# Patient Record
Sex: Female | Born: 1985
Health system: Southern US, Community
[De-identification: ages and names within clinical notes are randomized; demographics above are authoritative.]

## PROBLEM LIST (undated history)

## (undated) DIAGNOSIS — E0590 Thyrotoxicosis, unspecified without thyrotoxic crisis or storm: Secondary | ICD-10-CM

## (undated) HISTORY — DX: Thyrotoxicosis, unspecified without thyrotoxic crisis or storm: E05.90

---

## 2016-08-28 DIAGNOSIS — E538 Deficiency of other specified B group vitamins: Secondary | ICD-10-CM | POA: Insufficient documentation

## 2018-09-10 LAB — POCT URINE PREGNANCY: Preg Test, Ur: POSITIVE — AB

## 2018-09-10 LAB — CHG URINE PREGNANCY TEST VISUAL COLOR CMPRSN METHS: Preg Test, Ur: POSITIVE

## 2018-09-29 ENCOUNTER — Encounter: Payer: Self-pay | Admitting: Obstetrics and Gynecology

## 2018-09-30 ENCOUNTER — Encounter: Payer: Self-pay | Admitting: Obstetrics and Gynecology

## 2018-09-30 ENCOUNTER — Ambulatory Visit: Payer: Self-pay

## 2018-09-30 ENCOUNTER — Ambulatory Visit (INDEPENDENT_AMBULATORY_CARE_PROVIDER_SITE_OTHER): Payer: No Typology Code available for payment source | Admitting: Obstetrics and Gynecology

## 2018-09-30 VITALS — BP 117/68 | HR 96 | Ht 63.0 in | Wt 164.0 lb

## 2018-09-30 DIAGNOSIS — O09899 Supervision of other high risk pregnancies, unspecified trimester: Secondary | ICD-10-CM

## 2018-09-30 DIAGNOSIS — Z98891 History of uterine scar from previous surgery: Secondary | ICD-10-CM

## 2018-09-30 DIAGNOSIS — O0990 Supervision of high risk pregnancy, unspecified, unspecified trimester: Secondary | ICD-10-CM

## 2018-09-30 DIAGNOSIS — O09891 Supervision of other high risk pregnancies, first trimester: Secondary | ICD-10-CM | POA: Diagnosis not present

## 2018-09-30 DIAGNOSIS — R221 Localized swelling, mass and lump, neck: Secondary | ICD-10-CM | POA: Diagnosis not present

## 2018-09-30 DIAGNOSIS — Z3A00 Weeks of gestation of pregnancy not specified: Secondary | ICD-10-CM

## 2018-09-30 DIAGNOSIS — Z8639 Personal history of other endocrine, nutritional and metabolic disease: Secondary | ICD-10-CM

## 2018-09-30 DIAGNOSIS — O0991 Supervision of high risk pregnancy, unspecified, first trimester: Secondary | ICD-10-CM | POA: Diagnosis not present

## 2018-09-30 DIAGNOSIS — O3680X0 Pregnancy with inconclusive fetal viability, not applicable or unspecified: Secondary | ICD-10-CM

## 2018-09-30 DIAGNOSIS — Z8619 Personal history of other infectious and parasitic diseases: Secondary | ICD-10-CM

## 2018-09-30 NOTE — Progress Notes (Signed)
Pt informed that the ultrasound is considered a limited OB ultrasound and is not intended to be a complete ultrasound exam.  Patient also informed that the ultrasound is not being completed with the intent of assessing for fetal or placental anomalies or any pelvic abnormalities.  Explained that the purpose of today's ultrasound is to assess for viability, confirm dates.  Patient acknowledges the purpose of the exam and the limitations of the study.

## 2018-09-30 NOTE — Progress Notes (Signed)
Thyroid US @ [facility] scheduled for February 14th @ 1130.  Pt notified.

## 2018-09-30 NOTE — Progress Notes (Signed)
New OB Note  09/30/2018   [facility]  Chief Complaint: NOB  Transfer of Care Patient: no  History of Present Illness: Ms. Frances MostCharles is a 33 y.o. [id number] @ 7/1 weeks (EDC 9/29, based on Patient's last menstrual period was 08/11/2018 (exact date).=7wk u/s today.  Preg complicated by has History of cesarean delivery; History of herpes genitalis; History of hyperthyroidism; Short interval between pregnancies affecting pregnancy, antepartum; Neck fullness; and Supervision of high risk pregnancy, antepartum on their problem list.   Any events prior to today's visit: no Her periods were: qmonth, regular She was using no method when she conceived.  She has mild signs or symptoms of nausea/vomiting of pregnancy. She has Negative signs or symptoms of miscarriage or preterm labor On any medications around the time she conceived/early pregnancy: No   ROS: A 12-point review of systems was performed and negative, except as stated in the above HPI.  OBGYN History: As per HPI. OB History  Gravida Para Term Preterm AB Living  8 3 3   3 4   SAB TAB Ectopic Multiple Live Births  2   1   4     # Outcome Date GA Lbr Len/2nd Weight Sex Delivery Anes PTL Lv  8 Current           7 Term 03/09/18 5699w0d   F CS-LTranv Spinal  LIV  6 Term 09/18/16 5599w0d   M CS-LTranv Spinal    5 Gravida 12/08/12 4299w0d   M CS-LTranv Spinal  LIV  4 SAB 2013          3 Term 01/10/11 3499w0d   M CS-LTranv Spinal  LIV  2 Ectopic 2011          1 SAB 2010       N     Any issues with any prior pregnancies: no Prior children are healthy, doing well, and without any problems or issues: yes History of pap smears: Yes. Last pap smear 2019 and results were NILM/HPV neg   Past Medical History: Past Medical History:  Diagnosis Date  . Hyperthyroidism     Past Surgical History: Past Surgical History:  Procedure Laterality Date  . CESAREAN SECTION      Family History:  Family History  Problem  Relation Age of Onset  . Diabetes Mother   . Stroke Mother   . Diabetes Father   . Hypertension Father    She denies any history of mental retardation, birth defects or genetic disorders in her or the FOB's history  Social History:  Social History   Socioeconomic History  . Marital status: Married    Spouse name: Not on file  . Number of children: Not on file  . Years of education: Not on file  . Highest education level: Not on file  Occupational History  . Not on file  Social Needs  . Financial resource strain: Not on file  . Food insecurity:    Worry: Not on file    Inability: Not on file  . Transportation needs:    Medical: Not on file    Non-medical: Not on file  Tobacco Use  . Smoking status: Not on file  Substance and Sexual Activity  . Alcohol use: Not on file  . Drug use: Not on file  . Sexual activity: Not on file  Lifestyle  . Physical activity:    Days per week: Not on file    Minutes per session: Not on file  . Stress:  Not on file  Relationships  . Social connections:    Talks on phone: Not on file    Gets together: Not on file    Attends religious service: Not on file    Active member of club or organization: Not on file    Attends meetings of clubs or organizations: Not on file    Relationship status: Not on file  . Intimate partner violence:    Fear of current or ex partner: Not on file    Emotionally abused: Not on file    Physically abused: Not on file    Forced sexual activity: Not on file  Other Topics Concern  . Not on file  Social History Narrative  . Not on file     Allergy: No Known Allergies  Health Maintenance:  Mammogram Up to Date: not applicable  Current Outpatient Medications: PNV  Physical Exam:   BP 117/68   Pulse 96   Ht 5\' 3"  (1.6 m)   Wt 164 lb (74.4 kg)   LMP 08/11/2018 (Exact Date)   BMI 29.05 kg/m  Body mass index is 29.05 kg/m. Contractions: Not present Vag. Bleeding: None. FHTs: 130s  General  appearance: Well nourished, well developed female in no acute distress.  Neck:  Supple ?fullness bilaterally Cardiovascular: S1, S2 normal, no murmur, rub or gallop, regular rate and rhythm Respiratory:  Clear to auscultation bilateral. Normal respiratory effort Abdomen: positive bowel sounds and no masses, hernias; diffusely non tender to palpation, non distended Breasts: pt denies any breast s/s Neuro/Psych:  Normal mood and affect.  Skin:  Warm and dry.   Laboratory: none  Imaging:  U/s with SLIUP with FHR 160s at 6/6wks  Assessment: pt doing well  Plan: 1. History of cesarean delivery Will need repeat. Ask about BTL in late 2nd trimester  2. History of herpes genitalis ppx at 34-36wks  3. History of hyperthyroidism In patient's PCP notes. Not on any meds. Will check labs - TSH - T4, free - US THYROID; Future  4. Short interval between pregnancies affecting pregnancy, antepartum Still breastfeeding. Pt told okay to continue  5. Supervision of high risk pregnancy, antepartum Routine care. Offer. cffdna and carrier screening nv. Pt states had deliveries at [facility]. Pt's name in epic wrong here. Will have front desk change and see if we can get records from care everywhere re: ob hx, pap hx, and any genetics already done.   Pt told to let us know if she thinks she needs anything for her nausea with pregnancy.  - TSH - T4, free - Hemoglobin A1c - Comprehensive metabolic panel - Protein / creatinine ratio, urine - HIV antibody (with reflex) - Culture, OB Urine - Cervicovaginal ancillary only - US THYROID; Future - Obstetric Panel, Including HIV  6. Neck fullness - US THYROID; Future  7. Encounter to determine fetal viability of pregnancy, single or unspecified fetus - US OB Limited; Future  Problem list reviewed and updated.  Follow up in 3 weeks.   >50% of 35 min visit spent on counseling and coordination of care.     Cornelia Copa  MD Attending Center for [facility])

## 2018-10-01 LAB — PROTEIN / CREATININE RATIO, URINE
CREATININE, UR: 200.9 mg/dL
PROTEIN UR: 20.3 mg/dL
Protein/Creat Ratio: 101 mg/g creat (ref 0–200)

## 2018-10-01 LAB — OBSTETRIC PANEL, INCLUDING HIV
Antibody Screen: NEGATIVE
BASOS ABS: 0.1 10*3/uL (ref 0.0–0.2)
Basos: 1 %
EOS (ABSOLUTE): 0.2 10*3/uL (ref 0.0–0.4)
Eos: 2 %
HIV Screen 4th Generation wRfx: NONREACTIVE
Hematocrit: 35.7 % (ref 34.0–46.6)
Hemoglobin: 11.9 g/dL (ref 11.1–15.9)
Hepatitis B Surface Ag: NEGATIVE
Immature Grans (Abs): 0 10*3/uL (ref 0.0–0.1)
Immature Granulocytes: 0 %
Lymphocytes Absolute: 2 10*3/uL (ref 0.7–3.1)
Lymphs: 19 %
MCH: 27.9 pg (ref 26.6–33.0)
MCHC: 33.3 g/dL (ref 31.5–35.7)
MCV: 84 fL (ref 79–97)
Monocytes Absolute: 0.9 10*3/uL (ref 0.1–0.9)
Monocytes: 8 %
Neutrophils Absolute: 7.5 10*3/uL — ABNORMAL HIGH (ref 1.4–7.0)
Neutrophils: 70 %
Platelets: 395 10*3/uL (ref 150–450)
RBC: 4.27 x10E6/uL (ref 3.77–5.28)
RDW: 14.1 % (ref 11.7–15.4)
RPR Ser Ql: NONREACTIVE
Rh Factor: POSITIVE
Rubella Antibodies, IGG: 7.11 index (ref >0.99)
WBC: 10.6 10*3/uL (ref 3.4–10.8)

## 2018-10-01 LAB — COMPREHENSIVE METABOLIC PANEL
ALT: 14 IU/L (ref 0–32)
AST: 13 IU/L (ref 0–40)
Albumin/Globulin Ratio: 1.6 (ref 1.2–2.2)
Albumin: 4.1 g/dL (ref 3.8–4.8)
Alkaline Phosphatase: 81 IU/L (ref 39–117)
BUN/Creatinine Ratio: 14 (ref 9–23)
BUN: 9 mg/dL (ref 6–20)
Bilirubin Total: 0.3 mg/dL (ref 0.0–1.2)
CO2: 18 mmol/L — ABNORMAL LOW (ref 20–29)
Calcium: 9.6 mg/dL (ref 8.7–10.2)
Chloride: 102 mmol/L (ref 96–106)
Creatinine, Ser: 0.64 mg/dL (ref 0.57–1.00)
GFR calc Af Amer: 137 mL/min/{1.73_m2} (ref >59)
GFR calc non Af Amer: 118 mL/min/{1.73_m2} (ref >59)
Globulin, Total: 2.5 g/dL (ref 1.5–4.5)
Glucose: 71 mg/dL (ref 65–99)
Potassium: 4.2 mmol/L (ref 3.5–5.2)
Sodium: 136 mmol/L (ref 134–144)
Total Protein: 6.6 g/dL (ref 6.0–8.5)

## 2018-10-01 LAB — HEMOGLOBIN A1C
Est. average glucose Bld gHb Est-mCnc: 88 mg/dL
HEMOGLOBIN A1C: 4.7 % — AB (ref 4.8–5.6)

## 2018-10-01 LAB — TSH: TSH: 0.066 u[IU]/mL — ABNORMAL LOW (ref 0.450–4.500)

## 2018-10-01 LAB — T4, FREE: Free T4: 1.28 ng/dL (ref 0.82–1.77)

## 2018-10-02 ENCOUNTER — Ambulatory Visit (HOSPITAL_COMMUNITY)
Admission: RE | Admit: 2018-10-02 | Discharge: 2018-10-02 | Disposition: A | Discharge status: Home / Self Care | Payer: No Typology Code available for payment source | Source: Ambulatory Visit | Attending: Obstetrics and Gynecology | Admitting: Obstetrics and Gynecology

## 2018-10-02 DIAGNOSIS — R221 Localized swelling, mass and lump, neck: Secondary | ICD-10-CM | POA: Insufficient documentation

## 2018-10-02 DIAGNOSIS — Z8639 Personal history of other endocrine, nutritional and metabolic disease: Secondary | ICD-10-CM | POA: Diagnosis not present

## 2018-10-02 DIAGNOSIS — O0990 Supervision of high risk pregnancy, unspecified, unspecified trimester: Secondary | ICD-10-CM | POA: Diagnosis present

## 2018-10-02 LAB — URINE CULTURE, OB REFLEX: Organism ID, Bacteria: NO GROWTH

## 2018-10-02 LAB — CULTURE, OB URINE

## 2018-10-03 ENCOUNTER — Encounter: Payer: Self-pay | Admitting: Obstetrics and Gynecology

## 2018-10-07 ENCOUNTER — Telehealth: Payer: Self-pay | Admitting: General Practice

## 2018-10-07 ENCOUNTER — Other Ambulatory Visit: Payer: Self-pay | Admitting: Obstetrics and Gynecology

## 2018-10-07 DIAGNOSIS — R221 Localized swelling, mass and lump, neck: Secondary | ICD-10-CM

## 2018-10-07 DIAGNOSIS — E038 Other specified hypothyroidism: Secondary | ICD-10-CM

## 2018-10-07 DIAGNOSIS — E042 Nontoxic multinodular goiter: Secondary | ICD-10-CM

## 2018-10-07 DIAGNOSIS — E039 Hypothyroidism, unspecified: Secondary | ICD-10-CM

## 2018-10-07 NOTE — Telephone Encounter (Signed)
Patient called and left message requesting thyroid test results. Reviewed with Dr Vergie Living who states patient needs endocrinology referral for hyperthyroidism. Referral placed to [facility].  Called patient and informed her of results and discussed referral. Told patient they should call her with an appointment but if she doesn't hear anything within a few days to call us back. Patient verbalized understanding & had no questions.

## 2018-10-22 ENCOUNTER — Encounter: Payer: Self-pay | Admitting: Obstetrics & Gynecology

## 2018-10-26 ENCOUNTER — Encounter: Payer: Self-pay | Admitting: Family Medicine

## 2018-10-26 ENCOUNTER — Ambulatory Visit (INDEPENDENT_AMBULATORY_CARE_PROVIDER_SITE_OTHER): Payer: Medicaid Other | Admitting: Family Medicine

## 2018-10-26 VITALS — BP 107/74 | HR 88 | Wt 160.6 lb

## 2018-10-26 DIAGNOSIS — O0991 Supervision of high risk pregnancy, unspecified, first trimester: Secondary | ICD-10-CM

## 2018-10-26 DIAGNOSIS — Z3A10 10 weeks gestation of pregnancy: Secondary | ICD-10-CM | POA: Diagnosis not present

## 2018-10-26 DIAGNOSIS — O09899 Supervision of other high risk pregnancies, unspecified trimester: Secondary | ICD-10-CM

## 2018-10-26 DIAGNOSIS — O0990 Supervision of high risk pregnancy, unspecified, unspecified trimester: Secondary | ICD-10-CM

## 2018-10-26 DIAGNOSIS — Z8639 Personal history of other endocrine, nutritional and metabolic disease: Secondary | ICD-10-CM

## 2018-10-26 NOTE — Progress Notes (Signed)
   PRENATAL VISIT NOTE  Subjective:  Frances Gould is a 33 y.o. [phone] at [redacted]w[redacted]d being seen today for ongoing prenatal care.  She is currently monitored for the following issues for this high-risk pregnancy and has History of cesarean delivery; History of herpes genitalis; History of hyperthyroidism; Short interval between pregnancies affecting pregnancy, antepartum; Neck fullness; and Supervision of high risk pregnancy, antepartum on their problem list.  Patient reports no complaints.  Contractions: Not present. Vag. Bleeding: None.  Movement: Absent. Denies leaking of fluid.   The following portions of the patient's history were reviewed and updated as appropriate: allergies, current medications, past family history, past medical history, past social history, past surgical history and problem list.   Objective:   Vitals:   10/26/18 1458  BP: 107/74  Pulse: 88  Weight: 160 lb 9.6 oz (72.8 kg)    Fetal Status: Fetal Heart Rate (bpm): 172   Movement: Absent     General:  Alert, oriented and cooperative. Patient is in no acute distress.  Skin: Skin is warm and dry. No rash noted.   Cardiovascular: Normal heart rate noted  Respiratory: Normal respiratory effort, no problems with respiration noted  Abdomen: Soft, gravid, appropriate for gestational age.  Pain/Pressure: Absent     Pelvic: Cervical exam deferred        Extremities: Normal range of motion.  Edema: None  Mental Status: Normal mood and affect. Normal behavior. Normal judgment and thought content.   Assessment and Plan:  Pregnancy: [id number] at [redacted]w[redacted]d 1. History of hyperthyroidism Repeat TFT's--to call and schedule with Endocrinology--risks reviewed - TSH - T3, free - T4, free  2. Short interval between pregnancies affecting pregnancy, antepartum   3. Supervision of high risk pregnancy, antepartum NIPT today - Genetic Screening  General obstetric precautions including but not limited to vaginal bleeding,  contractions, leaking of fluid and fetal movement were reviewed in detail with the patient. Please refer to After Visit Summary for other counseling recommendations.   Return in 4 weeks (on 11/23/2018).  Future Appointments  Date Time Provider Department Center  11/30/2018 10:15 AM Anyanwu, Jethro Bastos, MD [facility]    Reva Bores, MD

## 2018-10-26 NOTE — Patient Instructions (Signed)

## 2018-10-27 LAB — T3, FREE: T3, Free: 4.4 pg/mL (ref 2.0–4.4)

## 2018-10-27 LAB — T4, FREE: Free T4: 1.82 ng/dL — ABNORMAL HIGH (ref 0.82–1.77)

## 2018-10-27 LAB — TSH: TSH: <0.006 u[IU]/mL — ABNORMAL LOW (ref 0.450–4.500)

## 2018-11-03 ENCOUNTER — Encounter: Payer: Self-pay | Admitting: *Deleted

## 2018-11-03 LAB — POCT URINE PREGNANCY: Preg Test, Ur: POSITIVE — AB

## 2018-11-09 ENCOUNTER — Encounter: Payer: Self-pay | Admitting: *Deleted

## 2018-11-09 ENCOUNTER — Telehealth: Payer: Self-pay | Admitting: *Deleted

## 2018-11-09 NOTE — Telephone Encounter (Signed)
Melaya left a voice message she is calling about labwork for genetic tests

## 2018-11-11 NOTE — Progress Notes (Addendum)
Patient ID: Frances Gould, female   DOB: 30-Jan-1986, 33 y.o.   MRN: [id number]                                                                                                               Reason for Appointment:  Hyperthyroidism, new consultation  Referring healthcare provider: Dr. Banks Bing   Chief complaint: Overactive thyroid   History of Present Illness:   In 2011 the patient had been diagnosed with hyperthyroidism This was related to her pregnancy However she thinks that even before the pregnancy she had some minor symptoms of palpitations, feeling hot and sweaty, getting jittery and shaky and tendency to lose weight despite increased appetite The symptoms are more prominent during her pregnancy along with symptoms of weakness and fatigue Her labs in 2011 showed a high free T4 of 1.35 with low TSH.  She was likely treated with antithyroid drugs at that time  She apparently had more significant symptoms during her pregnancy in 2013 when her free T4 was 1.42 At that time antithyroid antibodies were negative but not tested for thyrotropin receptor antibody At that time she was treated with antithyroid drugs during pregnancy, initially PTU and subsequently methimazole Thyroid levels were back to normal in 10/2012 including TSH Apparently subsequently she did not have recurrence of hyperthyroidism despite below normal TSH which was persistent  Currently the patient is about 13 to [redacted] weeks pregnant  She continues to have symptoms of fatigue and weakness, some palpitations and feeling hot and sweaty along with shakiness but she does not think she has consistent symptoms daily She has not had any excessive vomiting recently  She appears to have lost weight despite advancing pregnancy  Wt Readings from Last 3 Encounters:  11/12/18 157 lb 9.6 oz (71.5 kg)  10/26/18 160 lb 9.6 oz (72.8 kg)  09/30/18 164 lb (74.4 kg)     Treatments so far: None  Thyroid function tests as  follows:     Lab Results  Component Value Date   FREET4 1.82 (H) 10/26/2018   FREET4 1.28 09/30/2018   T3FREE 4.4 10/26/2018   TSH <0.006 (L) 10/26/2018   TSH 0.066 (L) 09/30/2018    No results found for: THYROTRECAB   Allergies as of 11/12/2018   No Known Allergies     Medication List    as of November 12, 2018  4:36 PM   You have not been prescribed any medications.         Past Medical History:  Diagnosis Date  . Hyperthyroidism     Past Surgical History:  Procedure Laterality Date  . CESAREAN SECTION      Family History  Problem Relation Age of Onset  . Diabetes Mother   . Stroke Mother   . Diabetes Father   . Hypertension Father     Social History:  does not have a smoking history on file. She has never used smokeless tobacco. She reports previous alcohol use. She reports previous drug use.  Allergies: No Known Allergies  Review of Systems  Constitutional: Positive for weight loss.  HENT: Negative for trouble swallowing.   Eyes: Negative for visual disturbance.  Respiratory: Positive for shortness of breath.        She may get short of breath on exertion  Cardiovascular: Positive for palpitations.  Gastrointestinal:       Has had only minor nausea, recently better  Endocrine: Positive for heat intolerance. Negative for polydipsia.  Genitourinary: Positive for frequency.  Musculoskeletal: Negative for joint pain.  Skin: Negative for itching.  Neurological: Positive for weakness and tremors.  Psychiatric/Behavioral: Positive for nervousness.       Examination:   BP 130/62 (BP Location: Left Arm, Patient Position: Sitting, Cuff Size: Normal)   Pulse 80   Temp 98 F (36.7 C) (Oral)   Ht  (1.6 m)   Wt 157 lb 9.6 oz (71.5 kg)   LMP 08/11/2018 (Exact Date)   SpO2 99%   BMI 27.92 kg/m    General Appearance:  well-built and nourished, pleasant, not anxious or hyperkinetic.         Eyes: No abnormal prominence, lid lag or stare present.   No swelling of the eyelids   Neck: The thyroid is enlarged about 2-1/2 times normal on the right and about twice normal on the left, smooth, non-tender and diffuse.  There is some lymphadenopathy with very small nodes in the neck in the lateral parts mostly on the left.           Heart: normal S1 and S2, no murmurs .          Lungs: breath sounds are normal bilaterally without added sounds  Abdomen: no hepatosplenomegaly Pregnant uterus palpable  Extremities: hands are warm. No ankle edema.  Neurological:  No fine tremors are present. Deep tendon reflexes at biceps are brisk.  Skin: No rash, abnormal thickening of the skin on the lower legs seen     Assessment/Plan:   Hyperthyroidism, either Graves' disease or gestational transient thyrotoxicosis  Patient appears to have had recurrent hyperthyroidism during her pregnancies since 2011 Does not have hyperthyroidism in the interim indicating that gestational transient thyrotoxicosis is more likely Currently and previously has not had any eye signs of Graves' disease  Also previously has not had any positive microsomal antithyroid antibodies although thyrotropin receptor antibody has never been checked  However presence of goiter indicates that she may have Graves' disease unless her goiter is unrelated   Discussed with the patient the causes of hyperthyroidism including autoimmune nature of Graves' disease  Although patient is symptomatic recently she objectively does not have any typical signs of hyperthyroidism or tachycardia Also her free T4 is minimally increased along with upper normal free T3  For diagnostic purposes will need to check thyrotropin receptor antibody and recheck her thyroid levels If her thyroid levels are spontaneously improving this will be in favor of gestational transient thyrotoxicosis Also discussed that use of antithyroid drugs should be minimized during pregnancy and the target thyroid levels do not  need to be within the normal range  Patient understands the above discussion and treatment options. All questions were answered satisfactorily  Consult note sent to referring physician  Reather Littler 11/12/2018, 4:36 PM    Note: This office note was prepared with Dragon voice recognition system technology. Any transcriptional errors that result from this process are unintentional.  ADDENDUM: Thyroid levels and thyrotropin antibody normal indicating gestational transient thyrotoxicosis and no antithyroid drugs needed   Office Visit on 11/12/2018  Component Date Value Ref Range Status  . Thyrotropin Receptor Ab 11/12/2018 <1.10  0.00 - 1.75 IU/L Final  . Free T4 11/12/2018 1.14  0.60 - 1.60 ng/dL Final   Comment: Specimens from patients who are undergoing biotin therapy and /or ingesting biotin supplements may contain high levels of biotin.  The higher biotin concentration in these specimens interferes with this Free T4 assay.  Specimens that contain high levels  of biotin may cause false high results for this Free T4 assay.  Please interpret results in light of the total clinical presentation of the patient.    . T3, Free 11/12/2018 4.0  2.3 - 4.2 pg/mL Final

## 2018-11-12 ENCOUNTER — Other Ambulatory Visit: Payer: Self-pay

## 2018-11-12 ENCOUNTER — Ambulatory Visit (INDEPENDENT_AMBULATORY_CARE_PROVIDER_SITE_OTHER): Payer: Medicaid Other | Admitting: Endocrinology

## 2018-11-12 ENCOUNTER — Encounter: Payer: Self-pay | Admitting: Endocrinology

## 2018-11-12 VITALS — BP 130/62 | HR 80 | Temp 98.0°F | Ht 63.0 in | Wt 157.6 lb

## 2018-11-12 DIAGNOSIS — E0590 Thyrotoxicosis, unspecified without thyrotoxic crisis or storm: Secondary | ICD-10-CM

## 2018-11-12 DIAGNOSIS — E049 Nontoxic goiter, unspecified: Secondary | ICD-10-CM

## 2018-11-13 LAB — THYROTROPIN RECEPTOR AUTOABS

## 2018-11-13 LAB — T4, FREE: Free T4: 1.14 ng/dL (ref 0.60–1.60)

## 2018-11-13 LAB — T3, FREE: T3, Free: 4 pg/mL (ref 2.3–4.2)

## 2018-11-13 NOTE — Progress Notes (Signed)
Please call to let patient know that the thyroid levels as well as Graves' disease antibody test results are normal and no medication for thyroid needed.  She has had gestational hyperthyroidism related to pregnancy hormone levels and not from the thyroid itself.  Need to have her follow-up 6 weeks postpartum

## 2018-11-23 ENCOUNTER — Encounter: Payer: Medicaid Other | Admitting: Family Medicine

## 2018-11-23 NOTE — Telephone Encounter (Signed)
Bert also sent Mychart messages addressing this issue- and staff sent her response to her request.

## 2018-11-30 ENCOUNTER — Encounter: Payer: Self-pay | Admitting: General Practice

## 2018-11-30 ENCOUNTER — Other Ambulatory Visit: Payer: Self-pay | Admitting: General Practice

## 2018-11-30 ENCOUNTER — Encounter: Payer: Medicaid Other | Admitting: Obstetrics & Gynecology

## 2018-11-30 DIAGNOSIS — O0990 Supervision of high risk pregnancy, unspecified, unspecified trimester: Secondary | ICD-10-CM

## 2018-11-30 NOTE — Progress Notes (Unsigned)
Patient came by office to pick up blood pressure cuff and register for babyscripts. Enrolled patient in babyscripts & provided BP cuff. Instructed patient how to use blood pressure machine. Patient requests FHR while here today- FHR 162 bpm. Also scheduled patient for anatomy scan 5/6. Patient will return for office visit in 6 weeks.   Chase Caller RN BSN 11/30/18

## 2018-12-01 ENCOUNTER — Other Ambulatory Visit: Payer: Medicaid Other

## 2018-12-04 ENCOUNTER — Ambulatory Visit: Payer: Medicaid Other | Admitting: Endocrinology

## 2018-12-23 ENCOUNTER — Other Ambulatory Visit: Payer: Self-pay

## 2018-12-23 ENCOUNTER — Other Ambulatory Visit (HOSPITAL_COMMUNITY): Payer: Self-pay | Admitting: *Deleted

## 2018-12-23 ENCOUNTER — Ambulatory Visit (HOSPITAL_COMMUNITY): Payer: Medicaid Other | Admitting: *Deleted

## 2018-12-23 ENCOUNTER — Ambulatory Visit (HOSPITAL_COMMUNITY)
Admission: RE | Admit: 2018-12-23 | Discharge: 2018-12-23 | Disposition: A | Discharge status: Home / Self Care | Payer: Medicaid Other | Source: Ambulatory Visit | Attending: Obstetrics and Gynecology | Admitting: Obstetrics and Gynecology

## 2018-12-23 ENCOUNTER — Encounter (HOSPITAL_COMMUNITY): Payer: Self-pay

## 2018-12-23 VITALS — Temp 97.2°F

## 2018-12-23 DIAGNOSIS — E0590 Thyrotoxicosis, unspecified without thyrotoxic crisis or storm: Secondary | ICD-10-CM

## 2018-12-23 DIAGNOSIS — O0990 Supervision of high risk pregnancy, unspecified, unspecified trimester: Secondary | ICD-10-CM

## 2018-12-23 DIAGNOSIS — O4402 Placenta previa specified as without hemorrhage, second trimester: Secondary | ICD-10-CM

## 2018-12-23 DIAGNOSIS — O99282 Endocrine, nutritional and metabolic diseases complicating pregnancy, second trimester: Secondary | ICD-10-CM | POA: Diagnosis not present

## 2018-12-23 DIAGNOSIS — O4400 Placenta previa specified as without hemorrhage, unspecified trimester: Secondary | ICD-10-CM

## 2018-12-23 DIAGNOSIS — Z3A19 19 weeks gestation of pregnancy: Secondary | ICD-10-CM

## 2018-12-23 DIAGNOSIS — O34219 Maternal care for unspecified type scar from previous cesarean delivery: Secondary | ICD-10-CM

## 2018-12-23 DIAGNOSIS — Z363 Encounter for antenatal screening for malformations: Secondary | ICD-10-CM

## 2019-01-12 ENCOUNTER — Telehealth (INDEPENDENT_AMBULATORY_CARE_PROVIDER_SITE_OTHER): Payer: Medicaid Other | Admitting: Obstetrics & Gynecology

## 2019-01-12 ENCOUNTER — Other Ambulatory Visit: Payer: Self-pay

## 2019-01-12 VITALS — BP 104/77 | HR 81 | Wt 164.4 lb

## 2019-01-12 DIAGNOSIS — O09892 Supervision of other high risk pregnancies, second trimester: Secondary | ICD-10-CM

## 2019-01-12 DIAGNOSIS — O0992 Supervision of high risk pregnancy, unspecified, second trimester: Secondary | ICD-10-CM | POA: Diagnosis not present

## 2019-01-12 DIAGNOSIS — O0990 Supervision of high risk pregnancy, unspecified, unspecified trimester: Secondary | ICD-10-CM

## 2019-01-12 DIAGNOSIS — Z8639 Personal history of other endocrine, nutritional and metabolic disease: Secondary | ICD-10-CM | POA: Diagnosis not present

## 2019-01-12 DIAGNOSIS — O09899 Supervision of other high risk pregnancies, unspecified trimester: Secondary | ICD-10-CM

## 2019-01-12 NOTE — Progress Notes (Signed)
I connected with  Frances Gould on 01/12/19 at  9:15 AM EDT by telephone and verified that I am speaking with the correct person using two identifiers.   I discussed the limitations, risks, security and privacy concerns of performing an evaluation and management service by telephone and virtually by MyChart app and the availability of in person appointments. I also discussed with the patient that there may be a patient responsible charge related to this service. The patient expressed understanding and agreed to proceed. Discussed will do gtt at 28 weeks - she states she does not do glucola just fasting level.   Ardie Mclennan,RN 01/12/2019  9:31 AM

## 2019-01-12 NOTE — Progress Notes (Signed)
   TELEHEALTH VIRTUAL OBSTETRICS VISIT ENCOUNTER NOTE  I connected with Frances Gould on 01/12/19 at  9:15 AM EDT by telephone at home and verified that I am speaking with the correct person using two identifiers.   I discussed the limitations, risks, security and privacy concerns of performing an evaluation and management service by telephone and the availability of in person appointments. I also discussed with the patient that there may be a patient responsible charge related to this service. The patient expressed understanding and agreed to proceed.  Subjective:  Frances Gould is a 33 y.o. [phone] at [redacted]w[redacted]d being followed for ongoing prenatal care.  She is currently monitored for the following issues for this high-risk pregnancy and has History of cesarean delivery; History of herpes genitalis; History of hyperthyroidism; Short interval between pregnancies affecting pregnancy, antepartum; Neck fullness; Supervision of high risk pregnancy, antepartum; and Vitamin B12 deficiency on their problem list.  Patient reports no complaints. Reports fetal movement. Denies any contractions, bleeding or leaking of fluid.   The following portions of the patient's history were reviewed and updated as appropriate: allergies, current medications, past family history, past medical history, past social history, past surgical history and problem list.   Objective:   General:  Alert, oriented and cooperative.   Mental Status: Normal mood and affect perceived. Normal judgment and thought content.  Rest of physical exam deferred due to type of encounter  Assessment and Plan:  Pregnancy: [id number] at [redacted]w[redacted]d 1. Supervision of high risk pregnancy, antepartum Placenta previa, pelvic rest was advised  2. Short interval between pregnancies affecting pregnancy, antepartum   3. History of hyperthyroidism   Preterm labor symptoms and general obstetric precautions including but not limited to vaginal bleeding,  contractions, leaking of fluid and fetal movement were reviewed in detail with the patient.  I discussed the assessment and treatment plan with the patient. The patient was provided an opportunity to ask questions and all were answered. The patient agreed with the plan and demonstrated an understanding of the instructions. The patient was advised to call back or seek an in-person office evaluation/go to [facility] at [facility] for any urgent or concerning symptoms. Please refer to After Visit Summary for other counseling recommendations.   I provided 12 minutes of non-face-to-face time during this encounter.  No follow-ups on file.  Future Appointments  Date Time Provider Department Center  02/24/2019  9:00 AM [facility] NURSE [facility]  02/24/2019  9:00 AM [facility] [place] 3 [facility]  06/30/2019 10:15 AM Reather Littler, MD [facility] None    Scheryl Darter, MD Center for [facility]

## 2019-01-12 NOTE — Progress Notes (Signed)
C/o pain/ pulling sometimes in her c/s wound. C/o migraines / sensitive to light but not diagnosed with migraines.  States has visual issues - has astigmatism - states has usual problems she had before pregnancy. Frances Gould

## 2019-01-12 NOTE — Patient Instructions (Signed)

## 2019-02-09 ENCOUNTER — Telehealth (INDEPENDENT_AMBULATORY_CARE_PROVIDER_SITE_OTHER): Payer: Medicaid Other | Admitting: *Deleted

## 2019-02-09 DIAGNOSIS — O0990 Supervision of high risk pregnancy, unspecified, unspecified trimester: Secondary | ICD-10-CM

## 2019-02-09 NOTE — Telephone Encounter (Signed)
Called pt regarding babyscripts.  Per review, pt last logged a BP 5/31.  When pt answered she stated she logged one last night.  Checked babyscripts and pt had logged BP 6/22.

## 2019-02-24 ENCOUNTER — Other Ambulatory Visit (HOSPITAL_COMMUNITY): Payer: Self-pay | Admitting: *Deleted

## 2019-02-24 ENCOUNTER — Encounter (HOSPITAL_COMMUNITY): Payer: Self-pay | Admitting: *Deleted

## 2019-02-24 ENCOUNTER — Ambulatory Visit (HOSPITAL_COMMUNITY): Payer: 59 | Admitting: *Deleted

## 2019-02-24 ENCOUNTER — Other Ambulatory Visit: Payer: Self-pay

## 2019-02-24 ENCOUNTER — Other Ambulatory Visit (HOSPITAL_COMMUNITY): Payer: Self-pay | Admitting: Maternal & Fetal Medicine

## 2019-02-24 ENCOUNTER — Ambulatory Visit (HOSPITAL_COMMUNITY)
Admission: RE | Admit: 2019-02-24 | Discharge: 2019-02-24 | Disposition: A | Discharge status: Home / Self Care | Payer: 59 | Source: Ambulatory Visit | Attending: Obstetrics and Gynecology | Admitting: Obstetrics and Gynecology

## 2019-02-24 VITALS — Temp 97.8°F

## 2019-02-24 DIAGNOSIS — O99283 Endocrine, nutritional and metabolic diseases complicating pregnancy, third trimester: Secondary | ICD-10-CM | POA: Diagnosis not present

## 2019-02-24 DIAGNOSIS — Z3A28 28 weeks gestation of pregnancy: Secondary | ICD-10-CM

## 2019-02-24 DIAGNOSIS — O4400 Placenta previa specified as without hemorrhage, unspecified trimester: Secondary | ICD-10-CM | POA: Diagnosis present

## 2019-02-24 DIAGNOSIS — Z362 Encounter for other antenatal screening follow-up: Secondary | ICD-10-CM

## 2019-02-24 DIAGNOSIS — E0590 Thyrotoxicosis, unspecified without thyrotoxic crisis or storm: Secondary | ICD-10-CM

## 2019-02-24 DIAGNOSIS — O4403 Placenta previa specified as without hemorrhage, third trimester: Secondary | ICD-10-CM

## 2019-02-24 DIAGNOSIS — O99280 Endocrine, nutritional and metabolic diseases complicating pregnancy, unspecified trimester: Secondary | ICD-10-CM | POA: Diagnosis not present

## 2019-02-24 DIAGNOSIS — O34219 Maternal care for unspecified type scar from previous cesarean delivery: Secondary | ICD-10-CM

## 2019-02-24 DIAGNOSIS — O43213 Placenta accreta, third trimester: Secondary | ICD-10-CM

## 2019-02-24 DIAGNOSIS — Z3686 Encounter for antenatal screening for cervical length: Secondary | ICD-10-CM

## 2019-03-01 ENCOUNTER — Other Ambulatory Visit: Payer: Self-pay | Admitting: *Deleted

## 2019-03-01 ENCOUNTER — Telehealth: Payer: Self-pay | Admitting: General Practice

## 2019-03-01 DIAGNOSIS — O09899 Supervision of other high risk pregnancies, unspecified trimester: Secondary | ICD-10-CM

## 2019-03-01 DIAGNOSIS — O0990 Supervision of high risk pregnancy, unspecified, unspecified trimester: Secondary | ICD-10-CM

## 2019-03-01 NOTE — Telephone Encounter (Signed)
Called [facility] to initiate transfer of care per Dr Harolyn Rutherford. [facility] has the patient's information and will call her to set up appts for transfer of care & consultation.

## 2019-03-04 ENCOUNTER — Other Ambulatory Visit: Payer: Self-pay

## 2019-03-04 ENCOUNTER — Ambulatory Visit (INDEPENDENT_AMBULATORY_CARE_PROVIDER_SITE_OTHER): Payer: 59 | Admitting: Obstetrics & Gynecology

## 2019-03-04 ENCOUNTER — Other Ambulatory Visit: Payer: 59

## 2019-03-04 VITALS — BP 116/69 | HR 85 | Wt 171.0 lb

## 2019-03-04 DIAGNOSIS — O0990 Supervision of high risk pregnancy, unspecified, unspecified trimester: Secondary | ICD-10-CM

## 2019-03-04 DIAGNOSIS — O09899 Supervision of other high risk pregnancies, unspecified trimester: Secondary | ICD-10-CM

## 2019-03-04 DIAGNOSIS — O4403 Placenta previa specified as without hemorrhage, third trimester: Secondary | ICD-10-CM

## 2019-03-04 DIAGNOSIS — O09893 Supervision of other high risk pregnancies, third trimester: Secondary | ICD-10-CM

## 2019-03-04 DIAGNOSIS — Z3A29 29 weeks gestation of pregnancy: Secondary | ICD-10-CM | POA: Diagnosis not present

## 2019-03-04 DIAGNOSIS — Z98891 History of uterine scar from previous surgery: Secondary | ICD-10-CM

## 2019-03-04 MED ORDER — FERROUS SULFATE 325 (65 FE) MG PO TABS: 325.0000 mg | ORAL_TABLET | Freq: Every day | ORAL | 3 refills | Status: DC

## 2019-03-04 MED ORDER — PRENATAL VITAMIN 27-0.8 MG PO TABS: 1.0000 | ORAL_TABLET | Freq: Every day | ORAL | 2 refills | Status: DC

## 2019-03-04 NOTE — Patient Instructions (Signed)
Gestational Diabetes Mellitus, Diagnosis Gestational diabetes (gestational diabetes mellitus) is a short-term (temporary) form of diabetes that can happen during pregnancy. It goes away after you give birth. It may be caused by one or both of these problems:  Your pancreas does not make enough of a hormone called insulin.  Your body does not respond in a normal way to insulin that it makes. Insulin lets sugars (glucose) go into cells in the body. This gives you energy. If you have diabetes, sugars cannot get into cells. This causes high blood sugar (hyperglycemia). If you get gestational diabetes, you are:  More likely to get it if you get pregnant again.  More likely to develop type 2 diabetes in the future. If gestational diabetes is treated, it may not hurt you or your baby. Your doctor will set treatment goals for you. In general, you should have these blood sugar levels:  After not eating for a long time (fasting): 95 mg/dL (5.3 mmol/L).  After meals (postprandial): ? One hour after a meal: at or below 140 mg/dL (7.8 mmol/L). ? Two hours after a meal: at or below 120 mg/dL (6.7 mmol/L).  A1c (hemoglobin A1c) level: 6-6.5%. Follow these instructions at home: Questions to ask your doctor   You may want to ask these questions: ? Do I need to meet with a diabetes educator? ? What equipment will I need to care for myself at home? ? What medicines do I need? When should I take them? ? How often do I need to check my blood sugar? ? What number can I call if I have questions? ? When is my next doctor's visit? General instructions  Take over-the-counter and prescription medicines only as told by your doctor.  Stay at a healthy weight during pregnancy.  Keep all follow-up visits as told by your doctor. This is important. Contact a doctor if:  Your blood sugar is at or above 240 mg/dL (13.3 mmol/L).  Your blood sugar is at or above 200 mg/dL (11.1 mmol/L) and you have ketones in  your pee (urine).  You have been sick or have had a fever for 2 days or more and you are not getting better.  You have any of these problems for more than 6 hours: ? You cannot eat or drink. ? You feel sick to your stomach (nauseous). ? You throw up (vomit). ? You have watery poop (diarrhea). Get help right away if:  Your blood sugar is lower than 54 mg/dL (3 mmol/L).  You get confused.  You have trouble: ? Thinking clearly. ? Breathing.  Your baby moves less than normal.  You have any of these: ? Moderate or large ketone levels in your pee. ? Blood coming from your vagina. ? Unusual fluid coming from your vagina. ? Early contractions. These may feel like tightness in your belly. Summary  Gestational diabetes is a short-term form of diabetes. It can happen while you are pregnant. It goes away after you give birth.  If gestational diabetes is treated, it may not hurt you or your baby. Your doctor will set treatment goals for you.  Keep all follow-up visits as told by your doctor. This is important. This information is not intended to replace advice given to you by your health care provider. Make sure you discuss any questions you have with your health care provider. Document Released: 11/27/2015 Document Revised: 09/11/2017 Document Reviewed: 09/08/2015 Elsevier Patient Education  2020 Elsevier Inc.  

## 2019-03-04 NOTE — Progress Notes (Signed)
   PRENATAL VISIT NOTE  Subjective:  Frances Gould is a 33 y.o. [phone] at [redacted]w[redacted]d being seen today for ongoing prenatal care.  She is currently monitored for the following issues for this high-risk pregnancy and has History of cesarean delivery; History of herpes genitalis; History of hyperthyroidism; Short interval between pregnancies affecting pregnancy, antepartum; Neck fullness; Supervision of high risk pregnancy, antepartum; and Vitamin B12 deficiency on their problem list.  Patient reports no complaints.  Contractions: Not present. Vag. Bleeding: None.  Movement: Present. Denies leaking of fluid.   The following portions of the patient's history were reviewed and updated as appropriate: allergies, current medications, past family history, past medical history, past social history, past surgical history and problem list.   Objective:   Vitals:   03/04/19 1019  BP: 116/69  Pulse: 85  Weight: 171 lb (77.6 kg)    Fetal Status: Fetal Heart Rate (bpm): 147   Movement: Present     General:  Alert, oriented and cooperative. Patient is in no acute distress.  Skin: Skin is warm and dry. No rash noted.   Cardiovascular: Normal heart rate noted  Respiratory: Normal respiratory effort, no problems with respiration noted  Abdomen: Soft, gravid, appropriate for gestational age.  Pain/Pressure: Absent     Pelvic: Cervical exam deferred        Extremities: Normal range of motion.  Edema: None  Mental Status: Normal mood and affect. Normal behavior. Normal judgment and thought content.   Assessment and Plan:  Pregnancy: [id number] at [redacted]w[redacted]d 1. History of cesarean delivery Declines glucose load for screening - Prenatal Vit-Fe Fumarate-FA (PRENATAL VITAMIN) 27-0.8 MG TABS; Take 1 tablet by mouth daily.  Dispense: 30 tablet; Refill: 2 - ferrous sulfate 325 (65 FE) MG tablet; Take 1 tablet (325 mg total) by mouth daily with breakfast.  Dispense: 30 tablet; Refill: 3  2. Supervision of high risk  pregnancy, antepartum  - Prenatal Vit-Fe Fumarate-FA (PRENATAL VITAMIN) 27-0.8 MG TABS; Take 1 tablet by mouth daily.  Dispense: 30 tablet; Refill: 2 - ferrous sulfate 325 (65 FE) MG tablet; Take 1 tablet (325 mg total) by mouth daily with breakfast.  Dispense: 30 tablet; Refill: 3 - Glucose - Hemoglobin A1c  3. Placenta previa centralis in third trimester Referred to [facility] for delivery  Preterm labor symptoms and general obstetric precautions including but not limited to vaginal bleeding, contractions, leaking of fluid and fetal movement were reviewed in detail with the patient. Please refer to After Visit Summary for other counseling recommendations.   Return in about 2 weeks (around 03/18/2019) for virtual.  Future Appointments  Date Time Provider [place]  03/24/2019 11:00 AM Jewell Alpine MFC-US  03/24/2019 11:00 AM [facility]-MFC [place] 3 [facility]-MFCUS MFC-US  06/30/2019 10:15 AM Elayne Snare, MD [facility] None    Emeterio Reeve, MD

## 2019-03-05 LAB — CBC
Hematocrit: 29.6 % — ABNORMAL LOW (ref 34.0–46.6)
Hemoglobin: 9.8 g/dL — ABNORMAL LOW (ref 11.1–15.9)
MCH: 25.5 pg — ABNORMAL LOW (ref 26.6–33.0)
MCHC: 33.1 g/dL (ref 31.5–35.7)
MCV: 77 fL — ABNORMAL LOW (ref 79–97)
Platelets: 357 10*3/uL (ref 150–450)
RBC: 3.84 x10E6/uL (ref 3.77–5.28)
RDW: 14.1 % (ref 11.7–15.4)
WBC: 9.8 10*3/uL (ref 3.4–10.8)

## 2019-03-05 LAB — RPR: RPR Ser Ql: NONREACTIVE

## 2019-03-05 LAB — HIV ANTIBODY (ROUTINE TESTING W REFLEX): HIV Screen 4th Generation wRfx: NONREACTIVE

## 2019-03-05 LAB — HEMOGLOBIN A1C
Est. average glucose Bld gHb Est-mCnc: 100 mg/dL
Hgb A1c MFr Bld: 5.1 % (ref 4.8–5.6)

## 2019-03-05 LAB — GLUCOSE, RANDOM: Glucose: 71 mg/dL (ref 65–99)

## 2019-03-18 ENCOUNTER — Telehealth (INDEPENDENT_AMBULATORY_CARE_PROVIDER_SITE_OTHER): Payer: 59 | Admitting: Obstetrics & Gynecology

## 2019-03-18 ENCOUNTER — Other Ambulatory Visit: Payer: Self-pay

## 2019-03-18 ENCOUNTER — Telehealth: Payer: Self-pay | Admitting: Obstetrics & Gynecology

## 2019-03-18 DIAGNOSIS — Z8639 Personal history of other endocrine, nutritional and metabolic disease: Secondary | ICD-10-CM

## 2019-03-18 DIAGNOSIS — O0993 Supervision of high risk pregnancy, unspecified, third trimester: Secondary | ICD-10-CM | POA: Diagnosis not present

## 2019-03-18 DIAGNOSIS — O09899 Supervision of other high risk pregnancies, unspecified trimester: Secondary | ICD-10-CM

## 2019-03-18 DIAGNOSIS — Z98891 History of uterine scar from previous surgery: Secondary | ICD-10-CM

## 2019-03-18 DIAGNOSIS — O0990 Supervision of high risk pregnancy, unspecified, unspecified trimester: Secondary | ICD-10-CM

## 2019-03-18 DIAGNOSIS — Z3A31 31 weeks gestation of pregnancy: Secondary | ICD-10-CM

## 2019-03-18 DIAGNOSIS — Z8619 Personal history of other infectious and parasitic diseases: Secondary | ICD-10-CM

## 2019-03-18 DIAGNOSIS — O09893 Supervision of other high risk pregnancies, third trimester: Secondary | ICD-10-CM | POA: Diagnosis not present

## 2019-03-18 DIAGNOSIS — O99019 Anemia complicating pregnancy, unspecified trimester: Secondary | ICD-10-CM

## 2019-03-18 DIAGNOSIS — O99013 Anemia complicating pregnancy, third trimester: Secondary | ICD-10-CM

## 2019-03-18 NOTE — Telephone Encounter (Signed)
Spoke with patient to get her scheduled for her next OB appointment. Patient stated that she is receiving care at [facility] [place] and the provider stated that she did not need to continue care with [place]. An appointment was not made for the patient.

## 2019-03-18 NOTE — Progress Notes (Signed)
[place] VIRTUAL VIDEO VISIT ENCOUNTER NOTE  Provider location: Center for [place] at [facility]   I connected with Frances Gould on 03/18/19 at  9:55 AM EDT by MyChart Video Encounter at home and verified that I am speaking with the correct person using two identifiers.   I discussed the limitations, risks, security and privacy concerns of performing an evaluation and management service virtually and the availability of in person appointments. I also discussed with the patient that there may be a patient responsible charge related to this service. The patient expressed understanding and agreed to proceed. Subjective:  Frances Gould is a 33 y.o. [phone] at 31w2dbeing seen today for ongoing prenatal care.  She is currently monitored for the following issues for this high-risk pregnancy and has History of cesarean delivery; History of herpes genitalis; History of hyperthyroidism; Short interval between pregnancies affecting pregnancy, antepartum; Neck fullness; Supervision of high risk pregnancy, antepartum; Vitamin B12 deficiency; and Anemia, antepartum on their problem list.  Patient reports no complaints.  Contractions: Not present. Vag. Bleeding: None.  Movement: Present. Denies any leaking of fluid.   The following portions of the patient's history were reviewed and updated as appropriate: allergies, current medications, past family history, past medical history, past social history, past surgical history and problem list.   Objective:  There were no vitals filed for this visit.  Fetal Status:     Movement: Present     General:  Alert, oriented and cooperative. Patient is in no acute distress.  Respiratory: Normal respiratory effort, no problems with respiration noted  Mental Status: Normal mood and affect. Normal behavior. Normal judgment and thought content.  Rest of physical exam deferred due to type of encounter  Imaging: [place]fm Ob  Transvaginal  Result Date: 02/24/2019 ----------------------------------------------------------------------  OBSTETRICS REPORT                       (Signed Final 02/24/2019 04:44 pm) ---------------------------------------------------------------------- Patient Info  ID #:       [id number]                         D.O.B.:  [birth year](32 yrs)  Name:       Frances Gould                Visit Date: 02/24/2019 09:53 am ---------------------------------------------------------------------- Performed By  Performed By:     CRodrigo RanBS      Ref. Address:     [address]                                                             [address]  Attending:        CSander Nephew     [facility]                    MD  [facility]  Referred By:      [facility] ---------------------------------------------------------------------- Orders   #  Description                          Code         Ordered By   1  [place] MFM OB FOLLOW UP                  38182.99     Sander Nephew   2  [place] MFM OB TRANSVAGINAL               37169.6      Sander Nephew  ----------------------------------------------------------------------   #  Order #                    Accession #                 Episode #   1  [id number]                  [id number]                  [id number]   2  [id number]                  [id number]                  [id number]  ---------------------------------------------------------------------- Indications   Encounter for other antenatal screening        Z36.2   follow-up   Hyperthyroid (per chart-pregnancy only) no     O99.280 E05.90   meds   History of cesarean delivery, currently        O34.219   pregnant x 4.   Placenta previa specified as without           O44.03   hemorrhage, third trimester   Encounter for cervical  length                  Z36.86   Placenta accreta in third trimester            O43.213   [redacted] weeks gestation of pregnancy                Z3A.28  ---------------------------------------------------------------------- Fetal Evaluation  Num Of Fetuses:         1  Fetal Heart Rate(bpm):  140  Cardiac Activity:       Observed  Presentation:           Transverse, head to maternal right  Placenta:               Central previa  P. Cord Insertion:      Previously Visualized  Amniotic Fluid  AFI FV:      Within normal limits  AFI Sum(cm)     %  Tile       Largest Pocket(cm)  14.86           51          5.1  RUQ(cm)       RLQ(cm)       LUQ(cm)        LLQ(cm)  3.14          5.1           2.64           3.98 ---------------------------------------------------------------------- Biometry  BPD:      70.3  mm     G. Age:  28w 2d         41  %    CI:        73.32   %    70 - 86                                                          FL/HC:      19.8   %    18.8 - 20.6  HC:      260.9  mm     G. Age:  28w 3d         26  %    HC/AC:      1.03        1.05 - 1.21  AC:      253.8  mm     G. Age:  29w 4d         83  %    FL/BPD:     73.4   %    71 - 87  FL:       51.6  mm     G. Age:  27w 4d         20  %    FL/AC:      20.3   %    20 - 24  HUM:      46.7  mm     G. Age:  27w 4d         32  %  Est. FW:    1270  gm    2 lb 13 oz      59  % ---------------------------------------------------------------------- OB History  Gravidity:    8         Term:   4         SAB:   2  Ectopic:      1        Living:  4 ---------------------------------------------------------------------- Gestational Age  LMP:           28w 1d        Date:  08/11/18                 EDD:   05/18/19  U/S Today:     28w 3d                                        EDD:   05/16/19  Best:          28w 1d     Det. By:  LMP  (08/11/18)  EDD:   05/18/19 ---------------------------------------------------------------------- Anatomy  Cranium:               Appears normal          LVOT:                   Appears normal  Cavum:                 Appears normal         Aortic Arch:            Appears normal  Ventricles:            Appears normal         Ductal Arch:            Appears normal  Choroid Plexus:        Appears normal         Diaphragm:              Appears normal  Cerebellum:            Appears normal         Stomach:                Appears normal, left                                                                        sided  Posterior Fossa:       Appears normal         Abdomen:                Appears normal  Nuchal Fold:           Previously seen        Abdominal Wall:         Appears nml (cord                                                                        insert, abd wall)  Face:                  Appears normal         Cord Vessels:           Appears normal (3                         (orbits and profile)                           vessel cord)  Lips:                  Appears normal         Kidneys:                Appear normal  Palate:                Previously seen  Bladder:                Appears normal  Thoracic:              Appears normal         Spine:                  Previously seen  Heart:                 Appears normal         Upper Extremities:      Previously seen                         (4CH, axis, and                         situs)  RVOT:                  Appears normal         Lower Extremities:      Previously seen  Other:  Nasal bone visualized. Heels previously visualized. ---------------------------------------------------------------------- Cervix Uterus Adnexa  Cervix  Normal appearance by transabdominal scan.  Uterus  No abnormality visualized.  Left Ovary  Within normal limits.  Right Ovary  Within normal limits.  Cul De Sac  No free fluid seen.  Adnexa  No abnormality visualized. ---------------------------------------------------------------------- Comments  I met with Ms. Vonseggern to discuss today's finding of a central  placenta  previa. She has a history of four prior cesarean  deliveries. I explained that the typical charactersitcs of a  placenta accreta are not present however there are areas  where the utero-placental interface is thinned. In addition,  given the prior cesarean deliveries and anterior placenta  previa I explained that the risk for placent accreta is high and  should be anticipated in this scenerio. Ms. Fredlund expressed  an interest in having her care transferred, I discussed this  with Dr. Ilda Basset and Dr.  Harolyn Rutherford, she was scheduled for an  in person visit on 7/16 and will send a consultation request for  delivery with possible hysterectomy. ---------------------------------------------------------------------- Impression  Normal interval growth.  Central previa with possible accreta ---------------------------------------------------------------------- Recommendations  Follow up growth in 4 weeks. ----------------------------------------------------------------------               Sander Nephew, MD Electronically Signed Final Report   02/24/2019 04:44 pm ----------------------------------------------------------------------  [place] Mfm Ob Follow Up  Result Date: 02/24/2019 ----------------------------------------------------------------------  OBSTETRICS REPORT                       (Signed Final 02/24/2019 04:44 pm) ---------------------------------------------------------------------- Patient Info  ID #:       [id number]                          D.O.B.:  Jun 07, 1986 (32 yrs)  Name:       Frances Gould                 Visit Date: 02/24/2019 09:53 am ---------------------------------------------------------------------- Performed By  Performed By:     Rodrigo Ran BS      Ref. Address:     [address]                    [address]  [address]  Attending:        Sander Nephew      [facility]                    MD                                        Fetal Care  Referred By:      [facility] ---------------------------------------------------------------------- Orders   #  Description                          Code         Ordered By   1  [place] MFM OB FOLLOW UP                  88916.94     Sander Nephew   2  [place] MFM OB TRANSVAGINAL               [id number]      Sander Nephew  ----------------------------------------------------------------------   #  Order #                    Accession #                 Episode #   1  [id number]                  [id number]                  [id number]   2  [id number]                  [id number]                  [id number]  ---------------------------------------------------------------------- Indications   Encounter for other antenatal screening        Z36.2   follow-up   Hyperthyroid (per chart-pregnancy only) no     O99.280 E05.90   meds   History of cesarean delivery, currently        O34.219   pregnant x 4.   Placenta previa specified as without           O44.03   hemorrhage, third trimester   Encounter for cervical length                  Z36.86   Placenta accreta in third trimester            O43.213   [redacted] weeks gestation of pregnancy                Z3A.28  ---------------------------------------------------------------------- Fetal Evaluation  Num Of Fetuses:  1  Fetal Heart Rate(bpm):  140  Cardiac Activity:       Observed  Presentation:           Transverse, head to maternal right  Placenta:               Central previa  P. Cord Insertion:      Previously Visualized  Amniotic Fluid  AFI FV:      Within normal limits  AFI Sum(cm)     %Tile       Largest Pocket(cm)  14.86           51          5.1  RUQ(cm)       RLQ(cm)       LUQ(cm)        LLQ(cm)  3.14          5.1           2.64           3.98 ---------------------------------------------------------------------- Biometry  BPD:      70.3   mm     G. Age:  28w 2d         41  %    CI:        73.32   %    70 - 86                                                          FL/HC:      19.8   %    18.8 - 20.6  HC:      260.9  mm     G. Age:  28w 3d         26  %    HC/AC:      1.03        1.05 - 1.21  AC:      253.8  mm     G. Age:  29w 4d         83  %    FL/BPD:     73.4   %    71 - 87  FL:       51.6  mm     G. Age:  27w 4d         20  %    FL/AC:      20.3   %    20 - 24  HUM:      46.7  mm     G. Age:  27w 4d         32  %  Est. FW:    1270  gm    2 lb 13 oz      59  % ---------------------------------------------------------------------- OB History  Gravidity:    8         Term:   4         SAB:   2  Ectopic:      1        Living:  4 ---------------------------------------------------------------------- Gestational Age  LMP:           28w 1d        Date:  08/11/18                 EDD:  05/18/19  U/S Today:     28w 3d                                        EDD:   05/16/19  Best:          28w 1d     Det. By:  LMP  (08/11/18)          EDD:   05/18/19 ---------------------------------------------------------------------- Anatomy  Cranium:               Appears normal         LVOT:                   Appears normal  Cavum:                 Appears normal         Aortic Arch:            Appears normal  Ventricles:            Appears normal         Ductal Arch:            Appears normal  Choroid Plexus:        Appears normal         Diaphragm:              Appears normal  Cerebellum:            Appears normal         Stomach:                Appears normal, left                                                                        sided  Posterior Fossa:       Appears normal         Abdomen:                Appears normal  Nuchal Fold:           Previously seen        Abdominal Wall:         Appears nml (cord                                                                        insert, abd wall)  Face:                  Appears normal         Cord Vessels:            Appears normal (3                         (orbits and profile)  vessel cord)  Lips:                  Appears normal         Kidneys:                Appear normal  Palate:                Previously seen        Bladder:                Appears normal  Thoracic:              Appears normal         Spine:                  Previously seen  Heart:                 Appears normal         Upper Extremities:      Previously seen                         (4CH, axis, and                         situs)  RVOT:                  Appears normal         Lower Extremities:      Previously seen  Other:  Nasal bone visualized. Heels previously visualized. ---------------------------------------------------------------------- Cervix Uterus Adnexa  Cervix  Normal appearance by transabdominal scan.  Uterus  No abnormality visualized.  Left Ovary  Within normal limits.  Right Ovary  Within normal limits.  Cul De Sac  No free fluid seen.  Adnexa  No abnormality visualized. ---------------------------------------------------------------------- Comments  I met with Ms. Zanders to discuss today's finding of a central  placenta previa. She has a history of four prior cesarean  deliveries. I explained that the typical charactersitcs of a  placenta accreta are not present however there are areas  where the utero-placental interface is thinned. In addition,  given the prior cesarean deliveries and anterior placenta  previa I explained that the risk for placent accreta is high and  should be anticipated in this scenerio. Ms. Fee expressed  an interest in having her care transferred, I discussed this  with Dr. Ilda Basset and Dr.  Harolyn Rutherford, she was scheduled for an  in person visit on 7/16 and will send a consultation request for  delivery with possible hysterectomy. ---------------------------------------------------------------------- Impression  Normal interval growth.  Central previa with possible accreta  ---------------------------------------------------------------------- Recommendations  Follow up growth in 4 weeks. ----------------------------------------------------------------------               Sander Nephew, MD Electronically Signed Final Report   02/24/2019 04:44 pm ----------------------------------------------------------------------   Assessment and Plan:  Pregnancy: [id number] at 76w2d1. Supervision of high risk pregnancy, antepartum [facility].  She reports that her OB visits have been there and she is planning ot deliver there. She does not understand why we are still making her appointments.  Note sent to nursing to to confirm the above and [facility]   2. Short interval between pregnancies affecting pregnancy, antepartum   3. History of hyperthyroidism   4. History of herpes genitalis   5.  History of cesarean delivery Pt has placenta acreta   6. Anemia, antepartum   Preterm labor symptoms and general obstetric precautions including but not limited to vaginal bleeding, contractions, leaking of fluid and fetal movement were reviewed in detail with the patient. I discussed the assessment and treatment plan with the patient. The patient was provided an opportunity to ask questions and all were answered. The patient agreed with the plan and demonstrated an understanding of the instructions. The patient was advised to call back or seek an in-person office evaluation/go to [facility] at [facility] for any urgent or concerning symptoms. Please refer to After Visit Summary for other counseling recommendations.   I provided 16 minutes of face-to-face time during this encounter.  No follow-ups on file.  Future Appointments  Date Time Provider [place]  03/24/2019 11:00 AM [facility]  03/24/2019 11:00 AM [facility]-MFC [place] 3 WH-MFCUS MFC-US  06/30/2019 10:15 AM Elayne Snare, MD  [facility] None    Lavonia Drafts, MD Center for [facility] [place]

## 2019-03-18 NOTE — Progress Notes (Signed)
Per chart review, patient saw [facility] on 7/22 for OB visit/transfer of care. Iron studies and Iron infusion was scheduled for the patient. They planned to follow up with her in 1 week for return OB visit.

## 2019-03-18 NOTE — Progress Notes (Signed)
I connected with  Frances Gould on 03/18/19 at  9:55 AM EDT by telephone and verified that I am speaking with the correct person using two identifiers.   I discussed the limitations, risks, security and privacy concerns of performing an evaluation and management service by telephone and the availability of in person appointments. I also discussed with the patient that there may be a patient responsible charge related to this service. The patient expressed understanding and agreed to proceed.  Derinda Late, RN 03/18/2019  9:52 AM   Patient is currently at [facility] getting an iron infusion.

## 2019-03-24 ENCOUNTER — Ambulatory Visit (HOSPITAL_COMMUNITY): Payer: 59

## 2019-03-24 ENCOUNTER — Encounter (HOSPITAL_COMMUNITY): Payer: Self-pay

## 2019-03-26 DIAGNOSIS — Z3A32 32 weeks gestation of pregnancy: Secondary | ICD-10-CM | POA: Diagnosis not present

## 2019-03-26 DIAGNOSIS — O43223 Placenta increta, third trimester: Secondary | ICD-10-CM | POA: Diagnosis not present

## 2019-03-27 DIAGNOSIS — O43223 Placenta increta, third trimester: Secondary | ICD-10-CM | POA: Diagnosis not present

## 2019-03-27 DIAGNOSIS — O4400 Placenta previa specified as without hemorrhage, unspecified trimester: Secondary | ICD-10-CM | POA: Diagnosis not present

## 2019-03-27 DIAGNOSIS — N939 Abnormal uterine and vaginal bleeding, unspecified: Secondary | ICD-10-CM | POA: Diagnosis not present

## 2019-03-27 DIAGNOSIS — Z98891 History of uterine scar from previous surgery: Secondary | ICD-10-CM | POA: Diagnosis not present

## 2019-03-27 DIAGNOSIS — Z3A32 32 weeks gestation of pregnancy: Secondary | ICD-10-CM | POA: Diagnosis not present

## 2019-03-28 DIAGNOSIS — O43223 Placenta increta, third trimester: Secondary | ICD-10-CM | POA: Diagnosis not present

## 2019-03-28 DIAGNOSIS — N939 Abnormal uterine and vaginal bleeding, unspecified: Secondary | ICD-10-CM | POA: Diagnosis not present

## 2019-03-28 DIAGNOSIS — Z98891 History of uterine scar from previous surgery: Secondary | ICD-10-CM | POA: Diagnosis not present

## 2019-03-28 DIAGNOSIS — O4400 Placenta previa specified as without hemorrhage, unspecified trimester: Secondary | ICD-10-CM | POA: Diagnosis not present

## 2019-03-29 DIAGNOSIS — O4400 Placenta previa specified as without hemorrhage, unspecified trimester: Secondary | ICD-10-CM | POA: Diagnosis not present

## 2019-03-29 DIAGNOSIS — N939 Abnormal uterine and vaginal bleeding, unspecified: Secondary | ICD-10-CM | POA: Diagnosis not present

## 2019-03-29 DIAGNOSIS — Z3A32 32 weeks gestation of pregnancy: Secondary | ICD-10-CM | POA: Diagnosis not present

## 2019-03-29 DIAGNOSIS — O43223 Placenta increta, third trimester: Secondary | ICD-10-CM | POA: Diagnosis not present

## 2019-03-29 DIAGNOSIS — Z98891 History of uterine scar from previous surgery: Secondary | ICD-10-CM | POA: Diagnosis not present

## 2019-03-30 DIAGNOSIS — Z3A33 33 weeks gestation of pregnancy: Secondary | ICD-10-CM | POA: Diagnosis not present

## 2019-03-30 DIAGNOSIS — O4403 Placenta previa specified as without hemorrhage, third trimester: Secondary | ICD-10-CM | POA: Diagnosis not present

## 2019-03-30 DIAGNOSIS — N939 Abnormal uterine and vaginal bleeding, unspecified: Secondary | ICD-10-CM | POA: Diagnosis not present

## 2019-03-30 DIAGNOSIS — O43223 Placenta increta, third trimester: Secondary | ICD-10-CM | POA: Diagnosis not present

## 2019-03-30 DIAGNOSIS — Z3A32 32 weeks gestation of pregnancy: Secondary | ICD-10-CM | POA: Diagnosis not present

## 2019-03-30 DIAGNOSIS — O4400 Placenta previa specified as without hemorrhage, unspecified trimester: Secondary | ICD-10-CM | POA: Diagnosis not present

## 2019-03-30 DIAGNOSIS — Z98891 History of uterine scar from previous surgery: Secondary | ICD-10-CM | POA: Diagnosis not present

## 2019-04-08 DIAGNOSIS — Z302 Encounter for sterilization: Secondary | ICD-10-CM | POA: Diagnosis not present

## 2019-04-08 DIAGNOSIS — Z3A34 34 weeks gestation of pregnancy: Secondary | ICD-10-CM | POA: Diagnosis not present

## 2019-04-08 DIAGNOSIS — O4403 Placenta previa specified as without hemorrhage, third trimester: Secondary | ICD-10-CM | POA: Diagnosis not present

## 2019-04-08 DIAGNOSIS — O34211 Maternal care for low transverse scar from previous cesarean delivery: Secondary | ICD-10-CM | POA: Diagnosis not present

## 2019-04-08 DIAGNOSIS — O43223 Placenta increta, third trimester: Secondary | ICD-10-CM | POA: Diagnosis not present

## 2019-04-09 DIAGNOSIS — G8918 Other acute postprocedural pain: Secondary | ICD-10-CM | POA: Diagnosis not present

## 2019-04-09 DIAGNOSIS — O43223 Placenta increta, third trimester: Secondary | ICD-10-CM | POA: Diagnosis not present

## 2019-04-09 DIAGNOSIS — R109 Unspecified abdominal pain: Secondary | ICD-10-CM | POA: Diagnosis not present

## 2019-04-10 DIAGNOSIS — O43223 Placenta increta, third trimester: Secondary | ICD-10-CM | POA: Diagnosis not present

## 2019-06-30 ENCOUNTER — Ambulatory Visit: Payer: Medicaid Other | Admitting: Endocrinology

## 2019-07-01 ENCOUNTER — Other Ambulatory Visit (INDEPENDENT_AMBULATORY_CARE_PROVIDER_SITE_OTHER): Payer: BC Managed Care – PPO

## 2019-07-01 ENCOUNTER — Other Ambulatory Visit: Payer: Self-pay

## 2019-07-01 DIAGNOSIS — E0590 Thyrotoxicosis, unspecified without thyrotoxic crisis or storm: Secondary | ICD-10-CM

## 2019-07-01 LAB — TSH: TSH: 0.26 u[IU]/mL — ABNORMAL LOW (ref 0.35–4.50)

## 2019-07-01 LAB — T4, FREE: Free T4: 0.95 ng/dL (ref 0.60–1.60)

## 2019-07-01 LAB — T3, FREE: T3, Free: 3.9 pg/mL (ref 2.3–4.2)

## 2019-07-05 ENCOUNTER — Ambulatory Visit (INDEPENDENT_AMBULATORY_CARE_PROVIDER_SITE_OTHER): Payer: BC Managed Care – PPO | Admitting: Endocrinology

## 2019-07-05 ENCOUNTER — Encounter: Payer: Self-pay | Admitting: Endocrinology

## 2019-07-05 ENCOUNTER — Other Ambulatory Visit: Payer: Self-pay

## 2019-07-05 VITALS — BP 110/7 | Ht 63.0 in | Wt 173.2 lb

## 2019-07-05 DIAGNOSIS — E049 Nontoxic goiter, unspecified: Secondary | ICD-10-CM

## 2019-07-05 NOTE — Progress Notes (Addendum)
Patient ID: Frances Gould, female   DOB: 11/05/1985, 33 y.o.   MRN: [id number]                                                                                                               Reason for Appointment: Follow-up of thyroid    Chief complaint: Follow-up after pregnancy   History of Present Illness:   Initial history on 11/12/2018:  In 2011 the patient had been diagnosed with hypothyroidism This was fired to her pregnancy However she thinks that even before the pregnancy she had some minor symptoms of palpitations, feeling hot and sweaty, getting jittery and shaky and tendency to lose weight despite increased appetite The symptoms are more prominent during her pregnancy along with symptoms of weakness and fatigue Her labs in 2011 showed a high free T4 of 1.35 with low TSH.  She was likely treated with antithyroid drugs at that time  She apparently had more significant symptoms during her pregnancy in 2013 when her free T4 was 1.42 At that time antithyroid antibodies were negative but not tested for thyrotropin receptor antibody At that time she was treated with antithyroid drugs during pregnancy, initially PTU and subsequently methimazole Thyroid levels were back to normal in 10/2012 including TSH Apparently subsequently she did not have recurrence of hyperthyroidism despite below normal TSH which was persistent  In 3/20 she was having symptoms of  fatigue and weakness, some palpitations and feeling hot and sweaty along with shakiness but she does not think she has consistent symptoms daily She was diagnosed to have hyperthyroidism secondary to pregnancy and no treatment was recommended  RECENT HISTORY:  She delivered her baby in late August 2020  Subsequently has no symptoms of palpitations, weakness or fatigue Her weight has leveled off recently She has been on no medications during pregnancy for the thyroid, was followed at [facility] Readings from Last 3 Encounters:   07/05/19 173 lb 3.2 oz (78.6 kg)  03/04/19 171 lb (77.6 kg)  01/12/19 164 lb 6.4 oz (74.6 kg)   She does take biotin Her TSH is minimally suppressed but her free T4 and T3 are normal  Thyroid function tests as follows:     Lab Results  Component Value Date   FREET4 0.95 07/01/2019   FREET4 1.14 11/12/2018   FREET4 1.82 (H) 10/26/2018   T3FREE 3.9 07/01/2019   T3FREE 4.0 11/12/2018   T3FREE 4.4 10/26/2018   TSH 0.26 (L) 07/01/2019   TSH <0.006 (L) 10/26/2018   TSH 0.066 (L) 09/30/2018    Lab Results  Component Value Date   THYROTRECAB <1.10 11/12/2018   Ultrasound of 09/2018 shows the following  IMPRESSION: 1. Enlarged, moderately heterogeneous and potentially hyperemic thyroid gland - nonspecific though could be seen in the setting of a thyroiditis. Clinical correlation is advised. 2. Findings suggestive of multinodular goiter. None of the discretely measured thyroid nodules, the majority of which appear cystic, meet imaging criteria to recommend percutaneous sampling or continued dedicated follow-up   Allergies as of 07/05/2019  Reactions   Latex       Medication List       Accurate as of July 05, 2019  9:21 AM. If you have any questions, ask your nurse or doctor.        STOP taking these medications   ferrous sulfate 325 (65 FE) MG tablet Stopped by: Reather Littler, MD   Prenatal Vitamin 27-0.8 MG Tabs Stopped by: Reather Littler, MD           Past Medical History:  Diagnosis Date  . Hyperthyroidism     Past Surgical History:  Procedure Laterality Date  . CESAREAN SECTION      Family History  Problem Relation Age of Onset  . Diabetes Mother   . Stroke Mother   . Diabetes Father   . Hypertension Father   . Thyroid disease Neg Hx     Social History:  reports that she has never smoked. She has never used smokeless tobacco. She reports previous alcohol use. She reports previous drug use.  Allergies:  Allergies  Allergen Reactions  . Latex       Review of Systems       Examination:   BP (!) 110/7 (BP Location: Left Arm, Patient Position: Sitting, Cuff Size: Normal)   Ht 5\' 3"  (1.6 m)   Wt 173 lb 3.2 oz (78.6 kg)   LMP 08/11/2018 (Exact Date)   SpO2 98%   BMI 30.68 kg/m   Thyroid exam: She has a diffuse thyroid enlargement about twice normal, fleshy texture and uniform Has only a small firm area in the medial right lobe next to the isthmus on swallowing No lymphadenopathy in the neck    Assessment/Plan:  Diffuse goiter with previously insignificant small nodules on ultrasound  Hyperthyroidism related to pregnancy, resolved Clinically she is doing well Her thyroid enlargement is smaller especially on the right lobe compared to during her pregnancy in March  Although thyroid levels are significantly improved her TSH is still slightly low but this may take some time to resolve  We will have her come back in 4 months for follow-up Advised her not to take any biotin for 2 weeks prior to her next lab  No need for follow-up ultrasound unless her clinical exam changes   April 07/05/2019, 9:21 AM    Note: This office note was prepared with Dragon voice recognition system technology. Any transcriptional errors that result from this process are unintentional.

## 2019-07-17 DIAGNOSIS — N3000 Acute cystitis without hematuria: Secondary | ICD-10-CM | POA: Diagnosis not present

## 2019-07-27 ENCOUNTER — Ambulatory Visit: Payer: Medicaid Other | Admitting: Internal Medicine

## 2019-08-21 ENCOUNTER — Other Ambulatory Visit: Payer: Self-pay

## 2019-08-21 ENCOUNTER — Encounter (HOSPITAL_COMMUNITY): Payer: Self-pay

## 2019-08-21 ENCOUNTER — Emergency Department (HOSPITAL_COMMUNITY)
Admission: EM | Admit: 2019-08-21 | Discharge: 2019-08-22 | Disposition: A | Discharge status: Home / Self Care | Payer: BC Managed Care – PPO | Attending: Emergency Medicine | Admitting: Emergency Medicine

## 2019-08-21 DIAGNOSIS — E876 Hypokalemia: Secondary | ICD-10-CM | POA: Diagnosis not present

## 2019-08-21 DIAGNOSIS — G43809 Other migraine, not intractable, without status migrainosus: Secondary | ICD-10-CM | POA: Insufficient documentation

## 2019-08-21 DIAGNOSIS — Z91040 Latex allergy status: Secondary | ICD-10-CM | POA: Diagnosis not present

## 2019-08-21 DIAGNOSIS — R55 Syncope and collapse: Secondary | ICD-10-CM | POA: Diagnosis not present

## 2019-08-21 DIAGNOSIS — R309 Painful micturition, unspecified: Secondary | ICD-10-CM | POA: Diagnosis present

## 2019-08-21 LAB — URINALYSIS, ROUTINE W REFLEX MICROSCOPIC
Bilirubin Urine: NEGATIVE
Glucose, UA: 50 mg/dL — AB
Hgb urine dipstick: NEGATIVE
Ketones, ur: NEGATIVE mg/dL
Leukocytes,Ua: NEGATIVE
Nitrite: POSITIVE — AB
Protein, ur: >=300 mg/dL — AB
Specific Gravity, Urine: 1.026 (ref 1.005–1.030)
WBC, UA: >50 WBC/hpf — ABNORMAL HIGH (ref 0–5)
pH: 5 (ref 5.0–8.0)

## 2019-08-21 LAB — CBC
HCT: 41.6 % (ref 36.0–46.0)
Hemoglobin: 13.5 g/dL (ref 12.0–15.0)
MCH: 28.6 pg (ref 26.0–34.0)
MCHC: 32.5 g/dL (ref 30.0–36.0)
MCV: 88.1 fL (ref 80.0–100.0)
Platelets: 389 10*3/uL (ref 150–400)
RBC: 4.72 MIL/uL (ref 3.87–5.11)
RDW: 14.2 % (ref 11.5–15.5)
WBC: 8 10*3/uL (ref 4.0–10.5)
nRBC: 0 % (ref 0.0–0.2)

## 2019-08-21 LAB — BASIC METABOLIC PANEL
Anion gap: 9 (ref 5–15)
BUN: 7 mg/dL (ref 6–20)
CO2: 21 mmol/L — ABNORMAL LOW (ref 22–32)
Calcium: 8.8 mg/dL — ABNORMAL LOW (ref 8.9–10.3)
Chloride: 107 mmol/L (ref 98–111)
Creatinine, Ser: 0.75 mg/dL (ref 0.44–1.00)
GFR calc Af Amer: >60 mL/min (ref >60)
GFR calc non Af Amer: >60 mL/min (ref >60)
Glucose, Bld: 96 mg/dL (ref 70–99)
Potassium: 3.1 mmol/L — ABNORMAL LOW (ref 3.5–5.1)
Sodium: 137 mmol/L (ref 135–145)

## 2019-08-21 LAB — I-STAT BETA HCG BLOOD, ED (MC, WL, AP ONLY): I-stat hCG, quantitative: <5 m[IU]/mL (ref <5)

## 2019-08-21 MED ORDER — SODIUM CHLORIDE 0.9% FLUSH: 3.0000 mL | Freq: Once | INTRAVENOUS | Status: DC

## 2019-08-21 NOTE — ED Triage Notes (Signed)
Pt reports that she has a hx of migraine and has one today and had a syncopal episode today, still having some dizziness.

## 2019-08-22 ENCOUNTER — Other Ambulatory Visit: Payer: Self-pay

## 2019-08-22 DIAGNOSIS — R55 Syncope and collapse: Secondary | ICD-10-CM | POA: Diagnosis not present

## 2019-08-22 LAB — CBG MONITORING, ED: Glucose-Capillary: 83 mg/dL (ref 70–99)

## 2019-08-22 MED ORDER — POTASSIUM CHLORIDE CRYS ER 20 MEQ PO TBCR
40.0000 meq | EXTENDED_RELEASE_TABLET | Freq: Once | ORAL | Status: AC
  Administered 2019-08-22: 40 meq via ORAL
  Filled 2019-08-22: qty 2

## 2019-08-22 MED ORDER — KETOROLAC TROMETHAMINE 30 MG/ML IJ SOLN
30.0000 mg | Freq: Once | INTRAMUSCULAR | Status: AC
  Administered 2019-08-22: 30 mg via INTRAMUSCULAR
  Filled 2019-08-22: qty 1

## 2019-08-22 MED ORDER — DEXAMETHASONE SODIUM PHOSPHATE 10 MG/ML IJ SOLN
10.0000 mg | Freq: Once | INTRAMUSCULAR | Status: AC
  Administered 2019-08-22: 10 mg via INTRAMUSCULAR
  Filled 2019-08-22: qty 1

## 2019-08-22 NOTE — ED Provider Notes (Signed)
[facility] EMERGENCY DEPARTMENT Provider Note   CSN: [id number] Arrival date & time: 08/21/19  2110     History Chief Complaint  Patient presents with  . Loss of Consciousness  . Headache    Frances Gould is a 34 y.o. female.  33 year old female presents with complaint of feeling lightheaded and near syncope earlier yesterday.  Patient states that she was setting at her work space at home and stood up around 5 PM and felt like she was lightheaded and fell to her knees.  Patient did not hit the ground, did not hit her head, no injuries as a result.  Patient states that she has a history of migraines, is seeing a thyroid specialist, not on any medications currently.  Patient reports frequent urinary tract infections, diagnosed with a UTI at urgent care at the end of November, prescribed 10 days of Macrobid and states she took about 5 days before stopping the course short.  Patient reports having little urinary discomfort yesterday and started taking her leftover Macrobid.  No further episodes since 5 PM yesterday, prolonged wait in the emergency room, reports ongoing migraine which is her typical headache.  Also reports feeling nauseous with abdominal discomfort ever since her hysterectomy in August of last year.        Past Medical History:  Diagnosis Date  . Hyperthyroidism     Patient Active Problem List   Diagnosis Date Noted  . Anemia, antepartum 03/18/2019  . History of cesarean delivery 09/30/2018  . History of herpes genitalis 09/30/2018  . History of hyperthyroidism 09/30/2018  . Short interval between pregnancies affecting pregnancy, antepartum 09/30/2018  . Neck fullness 09/30/2018  . Supervision of high risk pregnancy, antepartum 09/30/2018  . Vitamin B12 deficiency 08/28/2016    Past Surgical History:  Procedure Laterality Date  . CESAREAN SECTION       OB History    Gravida  8   Para  4   Term  4   Preterm      AB  3   Living    4     SAB  2   TAB      Ectopic  1   Multiple      Live Births  4           Family History  Problem Relation Age of Onset  . Diabetes Mother   . Stroke Mother   . Diabetes Father   . Hypertension Father   . Thyroid disease Neg Hx     Social History   Tobacco Use  . Smoking status: Never Smoker  . Smokeless tobacco: Never Used  Substance Use Topics  . Alcohol use: Not Currently  . Drug use: Not Currently    Home Medications Prior to Admission medications   Not on File    Allergies    Latex  Review of Systems   Review of Systems  Constitutional: Negative for fever.  Eyes: Negative for visual disturbance.  Gastrointestinal: Positive for abdominal pain and nausea.  Genitourinary: Positive for dysuria and frequency.  Musculoskeletal: Negative for arthralgias and myalgias.  Skin: Negative for rash and wound.  Allergic/Immunologic: Negative for immunocompromised state.  Neurological: Positive for light-headedness and headaches. Negative for speech difficulty and weakness.  Psychiatric/Behavioral: Negative for confusion.  All other systems reviewed and are negative.   Physical Exam Updated Vital Signs BP 107/72 (BP Location: Right Arm)   Pulse 64   Temp 97.7 F (36.5 C) (Oral)  Resp 19   Ht 5\' 3"  (1.6 m)   Wt 75.3 kg   SpO2 100%   BMI 29.41 kg/m   Physical Exam Vitals and nursing note reviewed.  Constitutional:      General: She is not in acute distress.    Appearance: She is well-developed. She is not diaphoretic.  HENT:     Head: Normocephalic and atraumatic.     Mouth/Throat:     Mouth: Mucous membranes are moist.     Pharynx: Oropharynx is clear.  Eyes:     General: No visual field deficit.    Extraocular Movements: Extraocular movements intact.     Right eye: Normal extraocular motion.     Left eye: Normal extraocular motion.     Pupils: Pupils are equal, round, and reactive to light.  Cardiovascular:     Rate and Rhythm: Normal  rate and regular rhythm.     Heart sounds: Normal heart sounds.  Pulmonary:     Effort: Pulmonary effort is normal.     Breath sounds: Normal breath sounds.  Abdominal:     Palpations: Abdomen is soft.     Tenderness: There is no abdominal tenderness.  Musculoskeletal:        General: No swelling or tenderness.     Cervical back: Neck supple.  Skin:    General: Skin is warm and dry.     Findings: No rash.  Neurological:     Mental Status: She is alert and oriented to person, place, and time.     GCS: GCS eye subscore is 4. GCS verbal subscore is 5. GCS motor subscore is 6.     Cranial Nerves: No cranial nerve deficit, dysarthria or facial asymmetry.     Sensory: No sensory deficit.     Motor: No weakness.     Coordination: Coordination normal.     Gait: Gait normal.     Deep Tendon Reflexes: Reflexes normal.  Psychiatric:        Behavior: Behavior normal.     ED Results / Procedures / Treatments   Labs (all labs ordered are listed, but only abnormal results are displayed) Labs Reviewed  BASIC METABOLIC PANEL - Abnormal; Notable for the following components:      Result Value   Potassium 3.1 (*)    CO2 21 (*)    Calcium 8.8 (*)    All other components within normal limits  URINALYSIS, ROUTINE W REFLEX MICROSCOPIC - Abnormal; Notable for the following components:   Color, Urine AMBER (*)    APPearance HAZY (*)    Glucose, UA 50 (*)    Protein, ur >=300 (*)    Nitrite POSITIVE (*)    WBC, UA >50 (*)    Bacteria, UA FEW (*)    All other components within normal limits  URINE CULTURE  CBC  CBG MONITORING, ED  I-STAT BETA HCG BLOOD, ED ([facility], [facility], [facility] ONLY)    EKG EKG Interpretation  Date/Time:  Saturday August 21 2019 21:18:40 EST Ventricular Rate:  87 PR Interval:  160 QRS Duration: 80 QT Interval:  400 QTC Calculation: 481 R Axis:   31 Text Interpretation: Normal sinus rhythm Prolonged QT Abnormal ECG Confirmed by 09[birth year]24[birth year]1981 [phone]) on 08/22/2019 6:57:07  AM   Radiology No results found.  Procedures Procedures (including critical care time)  Medications Ordered in ED Medications  sodium chloride flush (NS) 0.9 % injection 3 mL (3 mLs Intravenous Not Given 08/22/19 0733)  ketorolac (TORADOL) 30 MG/ML  injection 30 mg (has no administration in time range)  dexamethasone (DECADRON) injection 10 mg (has no administration in time range)  potassium chloride SA (KLOR-CON) CR tablet 40 mEq (40 mEq Oral Given 08/22/19 0751)    ED Course  I have reviewed the triage vital signs and the nursing notes.  Pertinent labs & imaging results that were available during my care of the patient were reviewed by me and considered in my medical decision making (see chart for details).  Clinical Course as of Aug 21 833  Sun Aug 22, 2019  [id number] 34 year old female with complaint of near syncopal episode today, chronic migraines, feeling unwell since hysterectomy 5 months ago and urinary symptoms.  Exam is unremarkable.  Patient had labs drawn on arrival in the ER 10+ hours ago showing mild hypokalemia which was orally repleted, CBC normal, urinalysis is contaminated but with nitrites, greater than 50 white cells and few bacteria, patient is already taking Macrobid.  Will add urine culture.  EKG is without significant findings, patient is not orthostatic.  Recommend patient follow-up with her PCP, home to rest and stay hydrated.  I offered Toradol and Decadron for patient's migraine which she accepts, advised she can take Benadryl upon arriving home if headache persists.   [LM]    Clinical Course User Index [LM] Alden Hipp   MDM Rules/Calculators/A&P                      Final Clinical Impression(s) / ED Diagnoses Final diagnoses:  Near syncope  Other migraine without status migrainosus, not intractable  Hypokalemia    Rx / DC Orders ED Discharge Orders    None       Jeannie Fend, PA-C 08/22/19 0835    Virgina Norfolk, DO 08/22/19  [phone]

## 2019-08-22 NOTE — Discharge Instructions (Signed)
Home to rest.  He may take Benadryl when you get home today. You can continue taking Advil or other NSAIDs 8 hours after your dose of Toradol today. Follow-up with your primary care provider for recheck. Push hydrating fluids today.  Review instructions and supplement with potassium rich foods.

## 2019-08-22 NOTE — ED Notes (Signed)
Pt called x2 for vitals recheck, no answer. 

## 2019-08-23 LAB — URINE CULTURE: Culture: NO GROWTH

## 2019-10-27 ENCOUNTER — Other Ambulatory Visit: Payer: Self-pay

## 2019-10-27 ENCOUNTER — Other Ambulatory Visit (INDEPENDENT_AMBULATORY_CARE_PROVIDER_SITE_OTHER): Payer: BC Managed Care – PPO

## 2019-10-27 DIAGNOSIS — E049 Nontoxic goiter, unspecified: Secondary | ICD-10-CM | POA: Diagnosis not present

## 2019-10-27 LAB — T3, FREE: T3, Free: 2.9 pg/mL (ref 2.3–4.2)

## 2019-10-27 LAB — T4, FREE: Free T4: 0.94 ng/dL (ref 0.60–1.60)

## 2019-10-27 LAB — TSH: TSH: 0.13 u[IU]/mL — ABNORMAL LOW (ref 0.35–4.50)

## 2019-11-03 ENCOUNTER — Ambulatory Visit: Payer: BC Managed Care – PPO | Admitting: Endocrinology

## 2019-11-03 NOTE — Progress Notes (Deleted)
Patient ID: Frances Gould, female   DOB: 27-Jul-1986, 34 y.o.   MRN: [id number]                                                                                                               Reason for Appointment: Follow-up of thyroid    Chief complaint: Follow-up after pregnancy   History of Present Illness:   Initial history on 11/12/2018:  In 2011 the patient had been diagnosed with hypothyroidism This was fired to her pregnancy However she thinks that even before the pregnancy she had some minor symptoms of palpitations, feeling hot and sweaty, getting jittery and shaky and tendency to lose weight despite increased appetite The symptoms are more prominent during her pregnancy along with symptoms of weakness and fatigue Her labs in 2011 showed a high free T4 of 1.35 with low TSH.  She was likely treated with antithyroid drugs at that time  She apparently had more significant symptoms during her pregnancy in 2013 when her free T4 was 1.42 At that time antithyroid antibodies were negative but not tested for thyrotropin receptor antibody At that time she was treated with antithyroid drugs during pregnancy, initially PTU and subsequently methimazole Thyroid levels were back to normal in 10/2012 including TSH Apparently subsequently she did not have recurrence of hyperthyroidism despite below normal TSH which was persistent  In 3/20 she was having symptoms of  fatigue and weakness, some palpitations and feeling hot and sweaty along with shakiness but she does not think she has consistent symptoms daily She was diagnosed to have hyperthyroidism secondary to pregnancy and no treatment was recommended  RECENT HISTORY:  She delivered her baby in late August 2020  Subsequently has no symptoms of palpitations, weakness or fatigue Her weight has leveled off recently She has been on no medications during pregnancy for the thyroid, was followed at [facility] Readings from Last 3 Encounters:    08/22/19 166 lb (75.3 kg)  07/05/19 173 lb 3.2 oz (78.6 kg)  03/04/19 171 lb (77.6 kg)   She does take biotin Her TSH is suppressed but her free T4 and T3 are normal  Thyroid function tests as follows:     Lab Results  Component Value Date   FREET4 0.94 10/27/2019   FREET4 0.95 07/01/2019   FREET4 1.14 11/12/2018   T3FREE 2.9 10/27/2019   T3FREE 3.9 07/01/2019   T3FREE 4.0 11/12/2018   TSH 0.13 (L) 10/27/2019   TSH 0.26 (L) 07/01/2019   TSH <0.006 (L) 10/26/2018    Lab Results  Component Value Date   THYROTRECAB <1.10 11/12/2018   Ultrasound of 09/2018 shows the following  IMPRESSION: 1. Enlarged, moderately heterogeneous and potentially hyperemic thyroid gland - nonspecific though could be seen in the setting of a thyroiditis. Clinical correlation is advised. 2. Findings suggestive of multinodular goiter. None of the discretely measured thyroid nodules, the majority of which appear cystic, meet imaging criteria to recommend percutaneous sampling or continued dedicated follow-up   Allergies as of 11/03/2019  Reactions   Latex       Medication List    as of November 03, 2019  8:14 AM   You have not been prescribed any medications.         Past Medical History:  Diagnosis Date  . Hyperthyroidism     Past Surgical History:  Procedure Laterality Date  . CESAREAN SECTION      Family History  Problem Relation Age of Onset  . Diabetes Mother   . Stroke Mother   . Diabetes Father   . Hypertension Father   . Thyroid disease Neg Hx     Social History:  reports that she has never smoked. She has never used smokeless tobacco. She reports previous alcohol use. She reports previous drug use.  Allergies:  Allergies  Allergen Reactions  . Latex      Review of Systems     Examination:   There were no vitals taken for this visit.  Thyroid exam: She has a diffuse thyroid enlargement about twice normal, fleshy texture and uniform Has only a small firm  area in the medial right lobe next to the isthmus on swallowing No lymphadenopathy in the neck    Assessment/Plan:  Diffuse goiter with previously insignificant small nodules on ultrasound  Hyperthyroidism related to pregnancy, resolved Clinically she is doing well Her thyroid enlargement is smaller especially on the right lobe compared to during her pregnancy in March  Although thyroid levels are significantly improved her TSH is still slightly low but this may take some time to resolve  We will have her come back in 4 months for follow-up Advised her not to take any biotin for 2 weeks prior to her next lab  No need for follow-up ultrasound unless her clinical exam changes   Elayne Snare 11/03/2019, 8:14 AM    Note: This office note was prepared with Dragon voice recognition system technology. Any transcriptional errors that result from this process are unintentional.

## 2020-04-23 ENCOUNTER — Emergency Department (HOSPITAL_COMMUNITY): Payer: BC Managed Care – PPO

## 2020-04-23 ENCOUNTER — Encounter (HOSPITAL_COMMUNITY): Payer: Self-pay | Admitting: Emergency Medicine

## 2020-04-23 ENCOUNTER — Other Ambulatory Visit: Payer: Self-pay

## 2020-04-23 DIAGNOSIS — Z20822 Contact with and (suspected) exposure to covid-19: Secondary | ICD-10-CM | POA: Insufficient documentation

## 2020-04-23 DIAGNOSIS — R0602 Shortness of breath: Secondary | ICD-10-CM | POA: Diagnosis present

## 2020-04-23 DIAGNOSIS — Z91040 Latex allergy status: Secondary | ICD-10-CM | POA: Insufficient documentation

## 2020-04-23 DIAGNOSIS — B349 Viral infection, unspecified: Secondary | ICD-10-CM | POA: Insufficient documentation

## 2020-04-23 DIAGNOSIS — E039 Hypothyroidism, unspecified: Secondary | ICD-10-CM | POA: Diagnosis not present

## 2020-04-23 LAB — BASIC METABOLIC PANEL
Anion gap: 11 (ref 5–15)
BUN: 11 mg/dL (ref 6–20)
CO2: 18 mmol/L — ABNORMAL LOW (ref 22–32)
Calcium: 9.3 mg/dL (ref 8.9–10.3)
Chloride: 110 mmol/L (ref 98–111)
Creatinine, Ser: 0.65 mg/dL (ref 0.44–1.00)
GFR calc Af Amer: >60 mL/min (ref >60)
GFR calc non Af Amer: >60 mL/min (ref >60)
Glucose, Bld: 87 mg/dL (ref 70–99)
Potassium: 3.6 mmol/L (ref 3.5–5.1)
Sodium: 139 mmol/L (ref 135–145)

## 2020-04-23 LAB — CBC
HCT: 40.2 % (ref 36.0–46.0)
Hemoglobin: 13.3 g/dL (ref 12.0–15.0)
MCH: 30.5 pg (ref 26.0–34.0)
MCHC: 33.1 g/dL (ref 30.0–36.0)
MCV: 92.2 fL (ref 80.0–100.0)
Platelets: 355 10*3/uL (ref 150–400)
RBC: 4.36 MIL/uL (ref 3.87–5.11)
RDW: 13.2 % (ref 11.5–15.5)
WBC: 6 10*3/uL (ref 4.0–10.5)
nRBC: 0 % (ref 0.0–0.2)

## 2020-04-23 LAB — URINALYSIS, ROUTINE W REFLEX MICROSCOPIC
Bilirubin Urine: NEGATIVE
Glucose, UA: NEGATIVE mg/dL
Hgb urine dipstick: NEGATIVE
Ketones, ur: 5 mg/dL — AB
Leukocytes,Ua: NEGATIVE
Nitrite: NEGATIVE
Protein, ur: NEGATIVE mg/dL
Specific Gravity, Urine: 1.021 (ref 1.005–1.030)
pH: 5 (ref 5.0–8.0)

## 2020-04-23 LAB — SARS CORONAVIRUS 2 BY RT PCR (HOSPITAL ORDER, PERFORMED IN ~~LOC~~ HOSPITAL LAB): SARS Coronavirus 2: NEGATIVE

## 2020-04-23 LAB — I-STAT BETA HCG BLOOD, ED (NOT ORDERABLE): I-stat hCG, quantitative: <5 m[IU]/mL (ref <5)

## 2020-04-23 LAB — TROPONIN I (HIGH SENSITIVITY): Troponin I (High Sensitivity): <2 ng/L (ref <18)

## 2020-04-23 NOTE — ED Triage Notes (Signed)
Patient is complaining of sob, body aches, fever,  mid chest pain. Patient states symptoms started yesterday.

## 2020-04-24 ENCOUNTER — Emergency Department (HOSPITAL_COMMUNITY)
Admission: EM | Admit: 2020-04-24 | Discharge: 2020-04-24 | Disposition: A | Discharge status: Home / Self Care | Payer: BC Managed Care – PPO | Attending: Emergency Medicine | Admitting: Emergency Medicine

## 2020-04-24 DIAGNOSIS — B349 Viral infection, unspecified: Secondary | ICD-10-CM

## 2020-04-24 MED ORDER — FLUTICASONE PROPIONATE 50 MCG/ACT NA SUSP: 2.0000 | Freq: Every day | NASAL | 0 refills | Status: AC

## 2020-04-24 MED ORDER — BENZONATATE 100 MG PO CAPS: 100.0000 mg | ORAL_CAPSULE | Freq: Three times a day (TID) | ORAL | 0 refills | Status: AC

## 2020-04-24 NOTE — ED Provider Notes (Signed)
[facility]-EMERGENCY DEPT Provider Note   CSN: [id number] Arrival date & time: 04/23/20  0093     History Chief Complaint  Patient presents with  . Shortness of Breath  . Chest Pain    Frances Gould is a 34 y.o. female.  HPI   34 year old female presenting for evaluation of URI symptoms.  States that yesterday she started having fevers, sweats, chills, rhinorrhea, congestion, ear fullness, weird feeling in her taste buds, nausea, diarrhea cough, burning in her chest and some shortness of breath as well.  States that her son was recently around his friend whose mother tested positive for Covid.  She is here concerned that she may have Covid.  She has not been immunized.  Past Medical History:  Diagnosis Date  . Hyperthyroidism     Patient Active Problem List   Diagnosis Date Noted  . Anemia, antepartum 03/18/2019  . History of cesarean delivery 09/30/2018  . History of herpes genitalis 09/30/2018  . History of hyperthyroidism 09/30/2018  . Short interval between pregnancies affecting pregnancy, antepartum 09/30/2018  . Neck fullness 09/30/2018  . Supervision of high risk pregnancy, antepartum 09/30/2018  . Vitamin B12 deficiency 08/28/2016    Past Surgical History:  Procedure Laterality Date  . CESAREAN SECTION       OB History    Gravida  8   Para  4   Term  4   Preterm      AB  3   Living  4     SAB  2   TAB      Ectopic  1   Multiple      Live Births  4           Family History  Problem Relation Age of Onset  . Diabetes Mother   . Stroke Mother   . Diabetes Father   . Hypertension Father   . Thyroid disease Neg Hx     Social History   Tobacco Use  . Smoking status: Never Smoker  . Smokeless tobacco: Never Used  Vaping Use  . Vaping Use: Never used  Substance Use Topics  . Alcohol use: Not Currently  . Drug use: Not Currently    Home Medications Prior to Admission medications   Medication Sig Start  Date End Date Taking? Authorizing Provider  benzonatate (TESSALON) 100 MG capsule Take 1 capsule (100 mg total) by mouth every 8 (eight) hours for 5 days. 04/24/20 04/29/20  Libni Fusaro S, PA-C  fluticasone (FLONASE) 50 MCG/ACT nasal spray Place 2 sprays into both nostrils daily. 04/24/20   Kru Allman S, PA-C    Allergies    Latex and Sulfa antibiotics  Review of Systems   Review of Systems  Constitutional: Positive for chills, fatigue and fever.  HENT: Positive for congestion, rhinorrhea and sore throat (improved). Negative for ear pain.   Eyes: Negative for pain and visual disturbance.  Respiratory: Positive for cough and shortness of breath.   Cardiovascular: Positive for chest pain ("burning").  Gastrointestinal: Positive for diarrhea and nausea. Negative for abdominal pain, constipation and vomiting.  Genitourinary: Negative for dysuria and hematuria.  Musculoskeletal: Negative for back pain.  Skin: Negative for color change and rash.  Neurological: Positive for headaches.  All other systems reviewed and are negative.   Physical Exam Updated Vital Signs BP 133/82 (BP Location: Right Arm)   Pulse (!) 107   Temp 97.8 F (36.6 C) (Oral)   Resp 16   Ht 5\' 3"  (  1.6 m)   Wt 63.5 kg   LMP 08/11/2018 (Exact Date)   SpO2 99%   BMI 24.80 kg/m   Physical Exam Vitals and nursing note reviewed.  Constitutional:      General: She is not in acute distress.    Appearance: She is well-developed. She is not ill-appearing.  HENT:     Head: Normocephalic and atraumatic.     Right Ear: Tympanic membrane normal.     Left Ear: Tympanic membrane normal.     Nose: Congestion present.     Comments: Bilateral nasal turbinates are swollen    Mouth/Throat:     Pharynx: No oropharyngeal exudate or posterior oropharyngeal erythema.  Eyes:     Conjunctiva/sclera: Conjunctivae normal.  Cardiovascular:     Rate and Rhythm: Normal rate and regular rhythm.     Heart sounds: Normal heart  sounds. No murmur heard.   Pulmonary:     Effort: Pulmonary effort is normal. No respiratory distress.     Breath sounds: Normal breath sounds. No decreased breath sounds, wheezing, rhonchi or rales.  Abdominal:     General: Abdomen is flat.  Musculoskeletal:     Cervical back: Neck supple.  Skin:    General: Skin is warm and dry.  Neurological:     Mental Status: She is alert.     ED Results / Procedures / Treatments   Labs (all labs ordered are listed, but only abnormal results are displayed) Labs Reviewed  BASIC METABOLIC PANEL - Abnormal; Notable for the following components:      Result Value   CO2 18 (*)    All other components within normal limits  URINALYSIS, ROUTINE W REFLEX MICROSCOPIC - Abnormal; Notable for the following components:   APPearance HAZY (*)    Ketones, ur 5 (*)    All other components within normal limits  SARS CORONAVIRUS 2 BY RT PCR (HOSPITAL ORDER, PERFORMED IN [facility])  CBC  I-STAT BETA HCG BLOOD, ED ([facility], [facility], [facility] ONLY)  I-STAT BETA HCG BLOOD, ED (NOT ORDERABLE)  TROPONIN I (HIGH SENSITIVITY)  TROPONIN I (HIGH SENSITIVITY)    EKG None  Radiology DG Chest 2 View  Result Date: 04/23/2020 CLINICAL DATA:  Dyspnea, chest pain EXAM: CHEST - 2 VIEW COMPARISON:  None. FINDINGS: The heart size and mediastinal contours are within normal limits. Both lungs are clear. The visualized skeletal structures are unremarkable. IMPRESSION: No active cardiopulmonary disease. Electronically Signed   By: Helyn Numbers MD   On: 04/23/2020 19:54    Procedures Procedures (including critical care time)  Medications Ordered in ED Medications - No data to display  ED Course  I have reviewed the triage vital signs and the nursing notes.  Pertinent labs & imaging results that were available during my care of the patient were reviewed by me and considered in my medical decision making (see chart for details).    MDM Rules/Calculators/A&P                           Patient presenting for evaluation for Covid.  Reports symptoms ongoing for 2 days.  Patient nontoxic, well-appearing, no distress.  Vital signs are reassuring.  Chest x-ray neg for pna, ptx. Labs are reassuring. Tested for Covid in the ED. Results negative. Suspect other viral syndrome. Advised on quarantine measures. Will give Rx for symptomatic management. Advised on f/u and return precautions. Pt voiced understanding of the plan and reasons to  return. All questions answered, pt stable for d/c.  Frances Gould was evaluated in Emergency Department on 04/24/2020 for the symptoms described in the history of present illness. She was evaluated in the context of the global COVID-19 pandemic, which necessitated consideration that the patient might be at risk for infection with the SARS-CoV-2 virus that causes COVID-19. Institutional protocols and algorithms that pertain to the evaluation of patients at risk for COVID-19 are in a state of rapid change based on information released by regulatory bodies including the CDC and federal and state organizations. These policies and algorithms were followed during the patient's care in the ED.  Final Clinical Impression(s) / ED Diagnoses Final diagnoses:  Viral syndrome    Rx / DC Orders ED Discharge Orders         Ordered    fluticasone (FLONASE) 50 MCG/ACT nasal spray  Daily        04/24/20 0335    benzonatate (TESSALON) 100 MG capsule  Every 8 hours        04/24/20 0335           Karrie Meres, PA-C 04/24/20 0336    Glynn Octave, MD 04/24/20 [phone]

## 2020-04-24 NOTE — Discharge Instructions (Signed)

## 2020-05-16 ENCOUNTER — Other Ambulatory Visit: Payer: Self-pay | Admitting: General Surgery

## 2020-05-16 DIAGNOSIS — M6208 Separation of muscle (nontraumatic), other site: Secondary | ICD-10-CM

## 2020-05-30 ENCOUNTER — Inpatient Hospital Stay: Admission: RE | Admit: 2020-05-30 | Payer: BC Managed Care – PPO | Source: Ambulatory Visit

## 2020-06-27 ENCOUNTER — Institutional Professional Consult (permissible substitution): Payer: BC Managed Care – PPO | Admitting: Plastic Surgery

## 2020-07-12 ENCOUNTER — Emergency Department (HOSPITAL_COMMUNITY): Payer: BC Managed Care – PPO

## 2020-07-12 ENCOUNTER — Encounter (HOSPITAL_COMMUNITY): Payer: Self-pay | Admitting: Emergency Medicine

## 2020-07-12 ENCOUNTER — Emergency Department (HOSPITAL_COMMUNITY)
Admission: EM | Admit: 2020-07-12 | Discharge: 2020-07-12 | Disposition: A | Discharge status: Home / Self Care | Payer: BC Managed Care – PPO | Attending: Emergency Medicine | Admitting: Emergency Medicine

## 2020-07-12 DIAGNOSIS — N3000 Acute cystitis without hematuria: Secondary | ICD-10-CM | POA: Insufficient documentation

## 2020-07-12 DIAGNOSIS — R102 Pelvic and perineal pain: Secondary | ICD-10-CM

## 2020-07-12 DIAGNOSIS — Z91040 Latex allergy status: Secondary | ICD-10-CM | POA: Insufficient documentation

## 2020-07-12 LAB — URINALYSIS, ROUTINE W REFLEX MICROSCOPIC
Bilirubin Urine: NEGATIVE
Glucose, UA: NEGATIVE mg/dL
Hgb urine dipstick: NEGATIVE
Ketones, ur: NEGATIVE mg/dL
Leukocytes,Ua: NEGATIVE
Nitrite: POSITIVE — AB
Protein, ur: NEGATIVE mg/dL
Specific Gravity, Urine: 1.02 (ref 1.005–1.030)
pH: 5 (ref 5.0–8.0)

## 2020-07-12 LAB — GC/CHLAMYDIA PROBE AMP (~~LOC~~) NOT AT ARMC
Chlamydia: NEGATIVE
Comment: NEGATIVE
Comment: NORMAL
Neisseria Gonorrhea: NEGATIVE

## 2020-07-12 LAB — WET PREP, GENITAL
Clue Cells Wet Prep HPF POC: NONE SEEN
Sperm: NONE SEEN
Trich, Wet Prep: NONE SEEN
Yeast Wet Prep HPF POC: NONE SEEN

## 2020-07-12 LAB — POC URINE PREG, ED: Preg Test, Ur: NEGATIVE

## 2020-07-12 MED ORDER — FLUCONAZOLE 150 MG PO TABS: 150.0000 mg | ORAL_TABLET | Freq: Once | ORAL | 0 refills | Status: AC

## 2020-07-12 MED ORDER — OXYCODONE-ACETAMINOPHEN 5-325 MG PO TABS
1.0000 | ORAL_TABLET | Freq: Once | ORAL | Status: AC
  Administered 2020-07-12: 1 via ORAL
  Filled 2020-07-12: qty 1

## 2020-07-12 MED ORDER — CEPHALEXIN 500 MG PO CAPS: 500.0000 mg | ORAL_CAPSULE | Freq: Three times a day (TID) | ORAL | 0 refills | Status: AC

## 2020-07-12 NOTE — ED Provider Notes (Signed)
[facility]-EMERGENCY DEPT Provider Note   CSN: [id number] Arrival date & time: 07/12/20  0058     History Chief Complaint  Patient presents with  . Abdominal Pain    Frances Gould is a 34 y.o. female.  The history is provided by the patient and medical records.    34 year old female with history of hyperthyroidism, presenting to the ED with pelvic pain.  States she is actually been having issues since July 2020 after her last C-section.  States she has had a total of 5 C-sections.  States she does suffer from issues with her pelvic floor and diastases recti.  States she has seen GYN and general surgery for this.  She has had CTs and MRI, 1 of which showed left ovarian mass.  She had a follow-up pelvic ultrasound which revealed a cyst.  States she has not had any new vaginal discharge no dysuria or urinary frequency.  She has not had any new sexual partners.  States she does not understand why she is still having this pain so far out from her C-section.  Past Medical History:  Diagnosis Date  . Hyperthyroidism     Patient Active Problem List   Diagnosis Date Noted  . Anemia, antepartum 03/18/2019  . History of cesarean delivery 09/30/2018  . History of herpes genitalis 09/30/2018  . History of hyperthyroidism 09/30/2018  . Short interval between pregnancies affecting pregnancy, antepartum 09/30/2018  . Neck fullness 09/30/2018  . Supervision of high risk pregnancy, antepartum 09/30/2018  . Vitamin B12 deficiency 08/28/2016    Past Surgical History:  Procedure Laterality Date  . CESAREAN SECTION       OB History    Gravida  8   Para  4   Term  4   Preterm      AB  3   Living  4     SAB  2   TAB      Ectopic  1   Multiple      Live Births  4           Family History  Problem Relation Age of Onset  . Diabetes Mother   . Stroke Mother   . Diabetes Father   . Hypertension Father   . Thyroid disease Neg Hx     Social  History   Tobacco Use  . Smoking status: Never Smoker  . Smokeless tobacco: Never Used  Vaping Use  . Vaping Use: Never used  Substance Use Topics  . Alcohol use: Not Currently  . Drug use: Not Currently    Home Medications Prior to Admission medications   Medication Sig Start Date End Date Taking? Authorizing Provider  fluticasone (FLONASE) 50 MCG/ACT nasal spray Place 2 sprays into both nostrils daily. 04/24/20   Couture, Cortni S, PA-C    Allergies    Latex and Sulfa antibiotics  Review of Systems   Review of Systems  Genitourinary: Positive for pelvic pain.  All other systems reviewed and are negative.   Physical Exam Updated Vital Signs BP 115/76 (BP Location: Left Arm)   Pulse 84   Temp 97.7 F (36.5 C) (Oral)   Resp 16   Ht 5\' 3"  (1.6 m)   Wt 59 kg   LMP 08/11/2018 (Exact Date)   SpO2 98%   BMI 23.03 kg/m   Physical Exam Vitals and nursing note reviewed.  Constitutional:      Appearance: She is well-developed.  HENT:     Head:  Normocephalic and atraumatic.  Eyes:     Conjunctiva/sclera: Conjunctivae normal.     Pupils: Pupils are equal, round, and reactive to light.  Cardiovascular:     Rate and Rhythm: Normal rate and regular rhythm.     Heart sounds: Normal heart sounds.  Pulmonary:     Effort: Pulmonary effort is normal.     Breath sounds: Normal breath sounds.  Abdominal:     General: Bowel sounds are normal.     Palpations: Abdomen is soft.  Genitourinary:    Comments: Exam chaperoned by RN Normal female external genitalia without visible lesions/rash, no appreciable discharge, left adnexal tenderness, no CMT or right adnexal tenderness Musculoskeletal:        General: Normal range of motion.     Cervical back: Normal range of motion.  Skin:    General: Skin is warm and dry.  Neurological:     Mental Status: She is alert and oriented to person, place, and time.     ED Results / Procedures / Treatments   Labs (all labs ordered are  listed, but only abnormal results are displayed) Labs Reviewed  WET PREP, GENITAL - Abnormal; Notable for the following components:      Result Value   WBC, Wet Prep HPF POC FEW (*)    All other components within normal limits  URINALYSIS, ROUTINE W REFLEX MICROSCOPIC - Abnormal; Notable for the following components:   Color, Urine AMBER (*)    Nitrite POSITIVE (*)    Bacteria, UA MANY (*)    All other components within normal limits  POC URINE PREG, ED  GC/CHLAMYDIA PROBE AMP ([place]) NOT AT [facility]    EKG None  Radiology No results found.  Procedures Procedures (including critical care time)  Medications Ordered in ED Medications - No data to display  ED Course  I have reviewed the triage vital signs and the nursing notes.  Pertinent labs & imaging results that were available during my care of the patient were reviewed by me and considered in my medical decision making (see chart for details).    MDM Rules/Calculators/A&P  34 year old female presenting to the ED with 1 year of pelvic pain since last C-section.  Has seen GYN and general surgery for such issues found to have ovarian cysts, pelvic floor dysfunction, and diastases recti.  She denies any fever or chills.  She is not had any vomiting recently.  Does have some mild suprapubic abdominal tenderness.  Pelvic exam performed, no significant discharge, left adnexal tenderness.  No cervical motion tenderness.  Wet prep is negative.  Gc/chl pending.  UA nitrite +, many bacteria.  Will treat with course of keflex.  Encouraged to follow-up closely with GYN if ongoing issues.  Return here for new concerns.  Final Clinical Impression(s) / ED Diagnoses Final diagnoses:  Pelvic pain  Acute cystitis without hematuria    Rx / DC Orders ED Discharge Orders         Ordered    cephALEXin (KEFLEX) 500 MG capsule  3 times daily        07/12/20 0331           Garlon Hatchet, PA-C 07/12/20 0343    Long, Arlyss Repress,  MD 07/18/20 1037

## 2020-07-12 NOTE — ED Triage Notes (Signed)
Patient here from home reporting lower abdominal pain/pelvic pain that started 4 days ago. Patient reports that she is not pregnant. Hx of cyst on ovaries.

## 2020-07-12 NOTE — Discharge Instructions (Signed)
Take the prescribed medication as directed.  Make sure to drink lots of fluids to stay hydrated. Follow-up with your primary care doctor. Return to the ED for new or worsening symptoms. 

## 2020-12-19 IMAGING — US US THYROID
1 series · 13 of 25 positions shown · non-contrast
Comparison: None.

CLINICAL DATA: Hyperthyroid.  Neck fullness on examination.

EXAM:
THYROID ULTRASOUND
TECHNIQUE: Ultrasound examination of the thyroid gland and adjacent soft
tissues was performed.

[Series 1: us thyroid · 0.07mm/px · 13 of 62 slices shown]
[im 1/62]
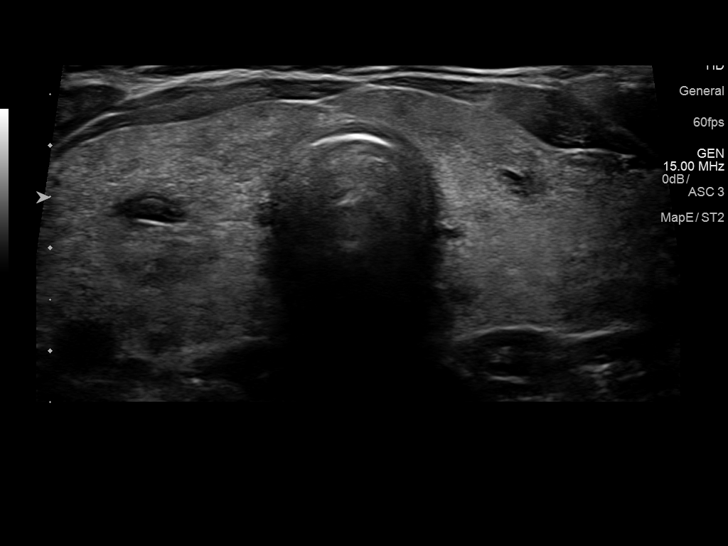
[im 6/62]
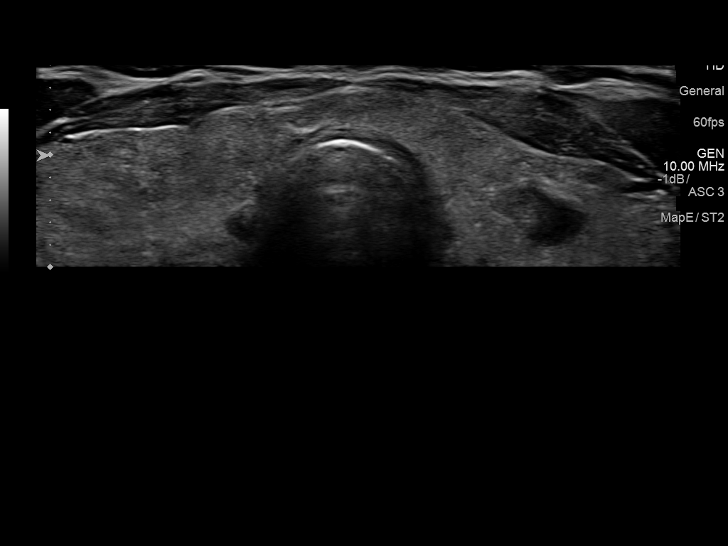
[im 11/62]
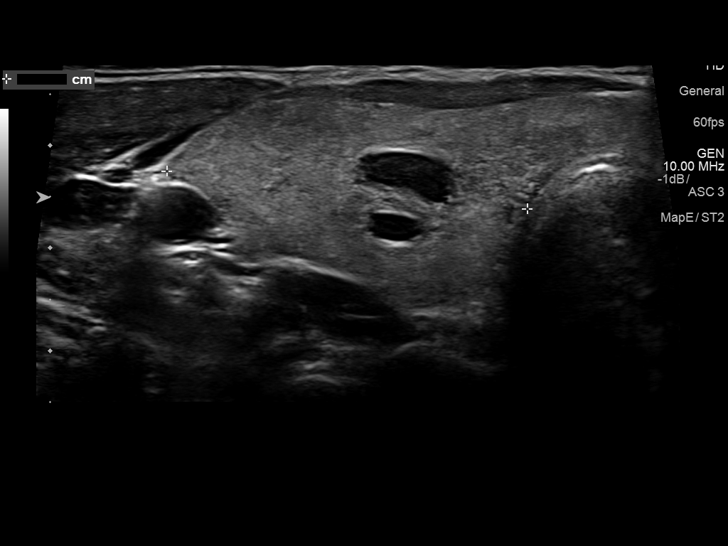
[im 16/62]
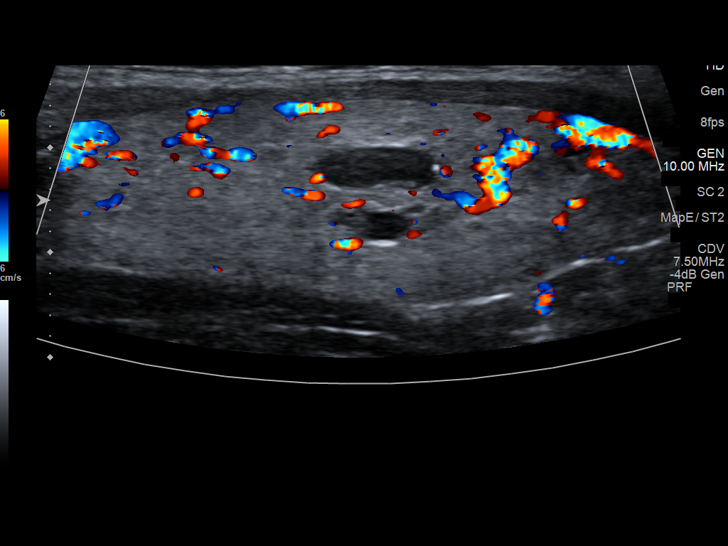
[im 21/62]
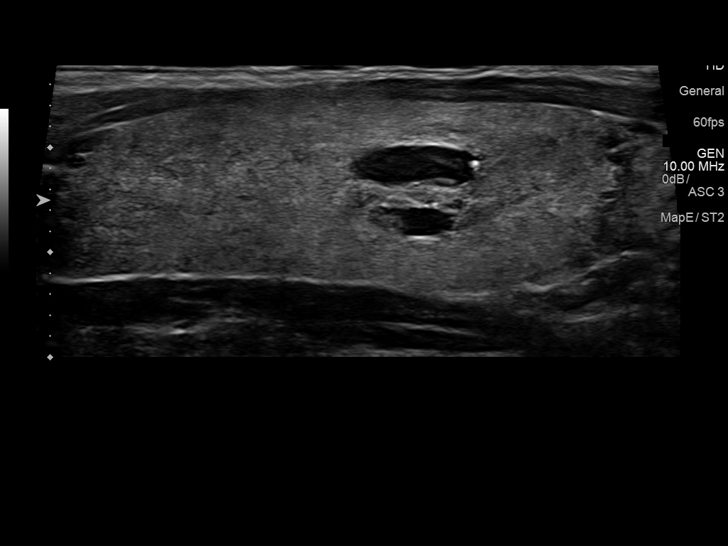
[im 26/62]
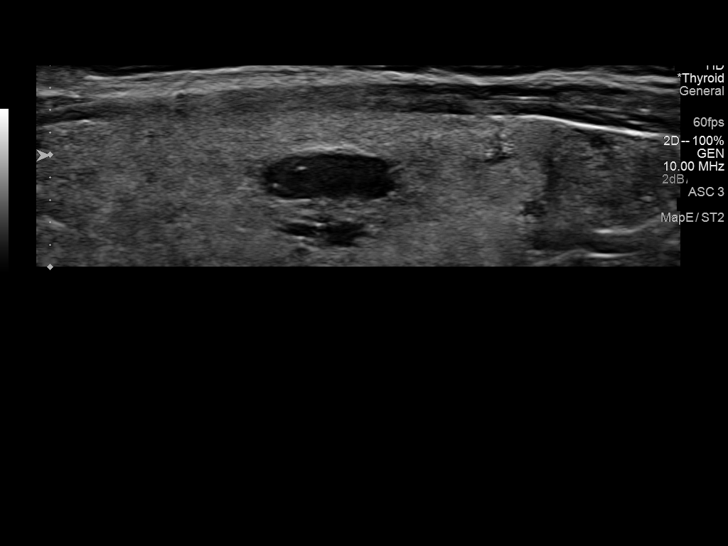
[im 31/62]
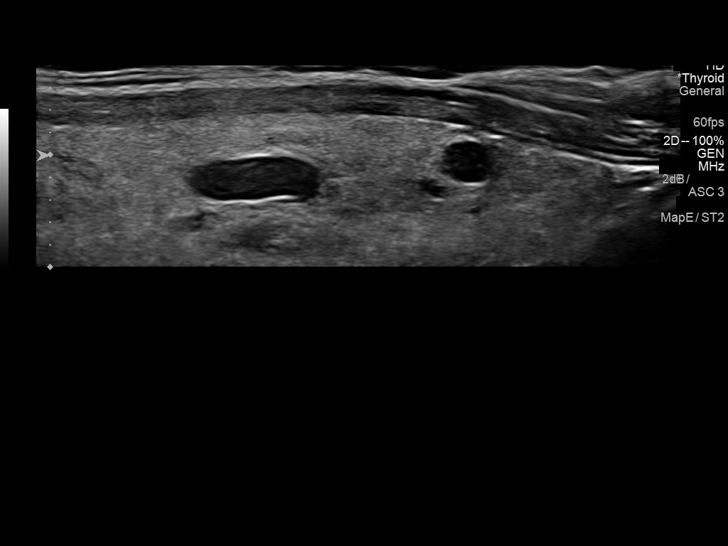
[im 36/62]
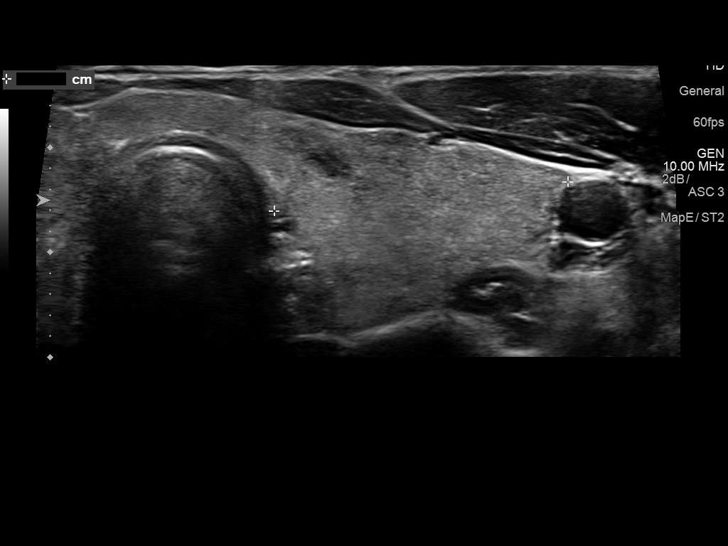
[im 41/62]
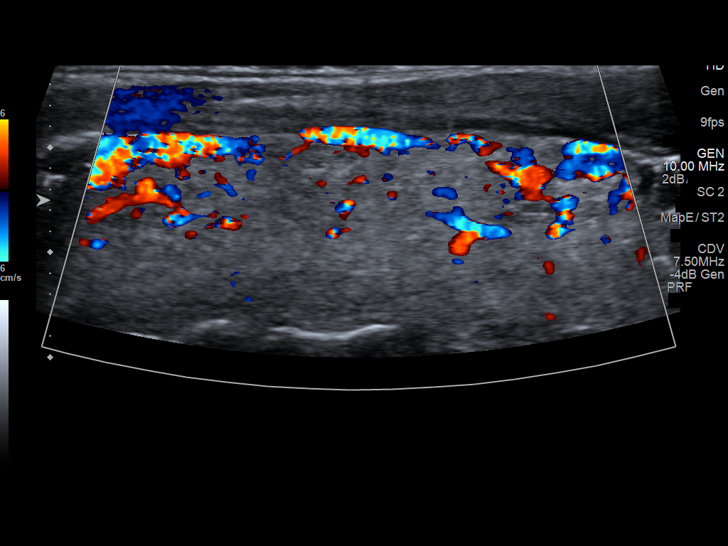
[im 46/62]
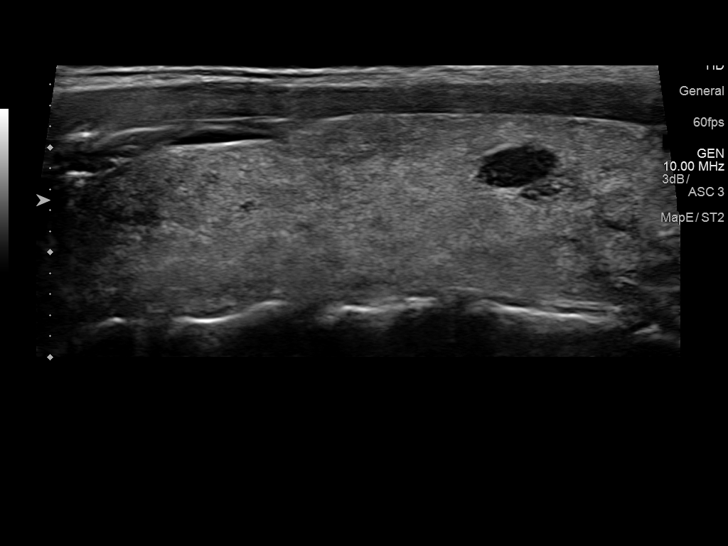
[im 51/62]
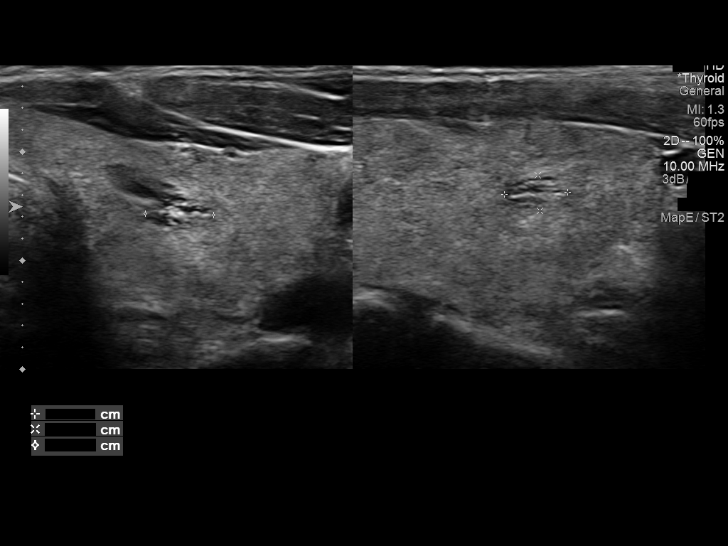
[im 56/62]
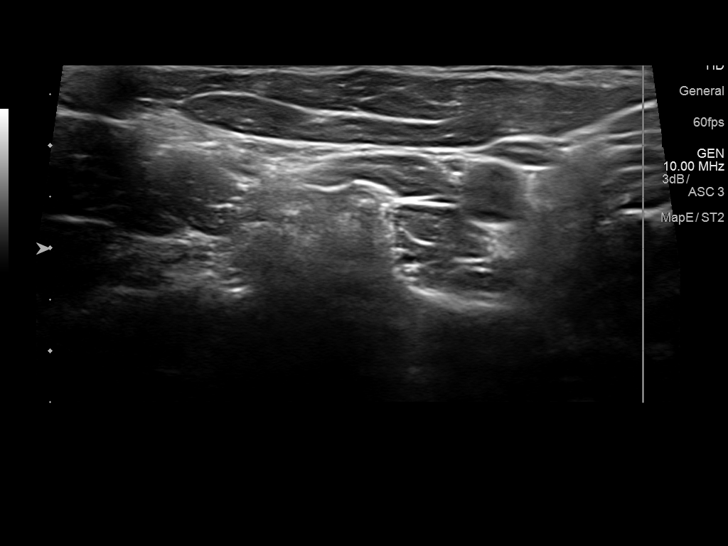
[im 62/62]
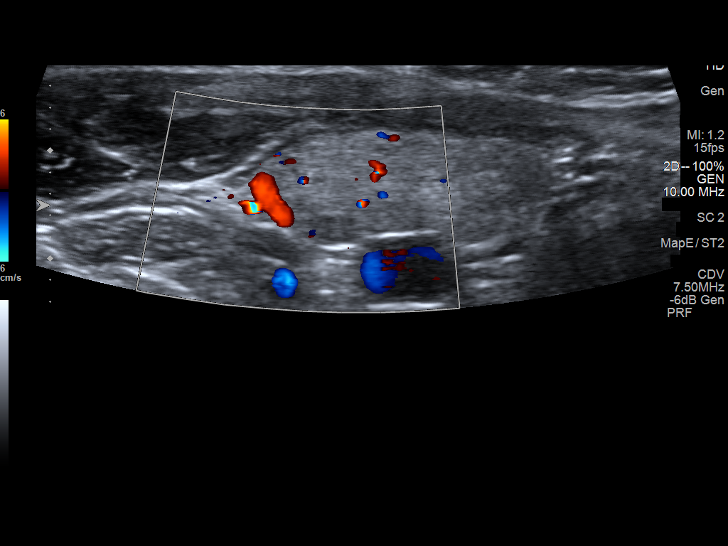

[13 of 25 positions shown; findings below may reference images not displayed]

FINDINGS: Parenchymal Echotexture: Moderately heterogenous mild diffuse
glandular hyperemia (images 5, 6, 16, 17 and 42).

Isthmus: Normal in size measures 0.3 cm in diameter

Right lobe: Enlarged measuring 6.6 x 1.9 x 3.5 cm

Left lobe: There is enlarged measuring 6.3 x 1.8 x 2.8 cm

_________________________________________________________

Estimated total number of nodules >/= 1 cm: 2

Number of spongiform nodules >/=  2 cm not described below (TR1): 0

Number of mixed cystic and solid nodules >/= 1.5 cm not described
below (TR2): 0

_________________________________________________________

There is an approximately 1.3 x 1.2 x 0.4 cm anechoic cyst within
mid aspect of the right lobe of the thyroid (labeled 1), which
contains an internal echogenic foci with ring down artifact
compatible with benign colloid. This benign colloid containing cyst
does not meet imaging criteria to recommend percutaneous sampling or
continued dedicated follow-up

There is an adjacent approximately 1.0 x 0.7 x 0.4 cm anechoic cyst
with the mid, posterior aspect the right lobe of the thyroid
(labeled 2), which does not meet imaging criteria to recommend
percutaneous sampling or continued dedicated follow-up

There is a approximately 0.7 cm anechoic cyst within the mid, medial
aspect the right lobe of the thyroid (labeled 3), which does not
meet imaging criteria to recommend percutaneous sampling or
continued dedicated follow-up.

_________________________________________________________

There is an approximately 0.9 x 0.8 x 0.5 cm partially cystic,
partially solid nodule within the mid aspect the left lobe of the
thyroid (labeled 4), which does not meet imaging criteria to
recommend percutaneous sampling or continued dedicated follow-up.

There is a approximately 0.6 x 0.6 x 0.3 cm spongiform/benign
appearing nodule within the mid aspect the left lobe of the thyroid
(labeled 5), which does not meet imaging criteria to recommend
percutaneous sampling or continued dedicated follow-up.
IMPRESSION: 1. Enlarged, moderately heterogeneous and potentially hyperemic
thyroid gland - nonspecific though could be seen in the setting of a
thyroiditis. Clinical correlation is advised.
2. Findings suggestive of multinodular goiter. None of the
discretely measured thyroid nodules, the majority of which appear
cystic, meet imaging criteria to recommend percutaneous sampling or
continued dedicated follow-up.

The above is in keeping with the ACR TI-RADS recommendations - [HOSPITAL] 1021;[DATE].
